# Patient Record
Sex: Female | Born: 1975 | Race: Black or African American | Hispanic: No | Marital: Single | Smoking: Never smoker
Health system: Southern US, Community
[De-identification: ages and names within clinical notes are randomized; demographics above are authoritative.]

## PROBLEM LIST (undated history)

## (undated) DIAGNOSIS — K219 Gastro-esophageal reflux disease without esophagitis: Secondary | ICD-10-CM

## (undated) DIAGNOSIS — F329 Major depressive disorder, single episode, unspecified: Secondary | ICD-10-CM

## (undated) DIAGNOSIS — F32A Depression, unspecified: Secondary | ICD-10-CM

## (undated) DIAGNOSIS — M1990 Unspecified osteoarthritis, unspecified site: Secondary | ICD-10-CM

## (undated) HISTORY — DX: Major depressive disorder, single episode, unspecified: F32.9

## (undated) HISTORY — DX: Unspecified osteoarthritis, unspecified site: M19.90

## (undated) HISTORY — PX: OTHER SURGICAL HISTORY: SHX169

## (undated) HISTORY — DX: Depression, unspecified: F32.A

## (undated) HISTORY — DX: Gastro-esophageal reflux disease without esophagitis: K21.9

---

## 2005-01-03 ENCOUNTER — Emergency Department (HOSPITAL_COMMUNITY): Admission: EM | Admit: 2005-01-03 | Discharge: 2005-01-03 | Payer: Self-pay | Admitting: Emergency Medicine

## 2010-12-26 ENCOUNTER — Ambulatory Visit: Payer: Self-pay | Admitting: Urology

## 2012-11-17 ENCOUNTER — Ambulatory Visit: Payer: Self-pay | Admitting: Internal Medicine

## 2014-06-29 ENCOUNTER — Ambulatory Visit: Payer: Self-pay | Admitting: Nurse Practitioner

## 2014-10-16 ENCOUNTER — Telehealth: Payer: Self-pay | Admitting: Internal Medicine

## 2014-10-16 ENCOUNTER — Ambulatory Visit (INDEPENDENT_AMBULATORY_CARE_PROVIDER_SITE_OTHER): Payer: 59 | Admitting: Gastroenterology

## 2014-10-16 ENCOUNTER — Encounter: Payer: Self-pay | Admitting: Gastroenterology

## 2014-10-16 VITALS — BP 118/73 | HR 76 | Temp 97.2°F | Resp 18 | Ht 64.0 in | Wt 236.0 lb

## 2014-10-16 DIAGNOSIS — K219 Gastro-esophageal reflux disease without esophagitis: Secondary | ICD-10-CM | POA: Insufficient documentation

## 2014-10-16 DIAGNOSIS — K5900 Constipation, unspecified: Secondary | ICD-10-CM

## 2014-10-16 MED ORDER — OMEPRAZOLE 20 MG PO CPDR: 20.0000 mg | DELAYED_RELEASE_CAPSULE | Freq: Every day | ORAL | Status: DC

## 2014-10-16 NOTE — Telephone Encounter (Signed)
PATIENT STATED THAT SHE WOULD CALL us ABOUT A FOLLOW UP APPOINTMENT

## 2014-10-16 NOTE — Assessment & Plan Note (Signed)
Chronic, without any concerning features. No improvement with Miralax or Linzess. Start Amitiza 8 mcg BID. Samples provided. If this works well, will call in prescription. Return in 6-8 weeks. High fiber diet.

## 2014-10-16 NOTE — Patient Instructions (Signed)
Start taking Prilosec once each morning, 30 minutes prior to breakfast.   For constipation: start taking Amitiza 1 gelcap WITH FOOD twice a day.   Please review the high fiber and reflux diet.   Food Choices for Gastroesophageal Reflux Disease When you have gastroesophageal reflux disease (GERD), the foods you eat and your eating habits are very important. Choosing the right foods can help ease the discomfort of GERD. WHAT GENERAL GUIDELINES DO I NEED TO FOLLOW?  Choose fruits, vegetables, whole grains, low-fat dairy products, and low-fat meat, fish, and poultry.  Limit fats such as oils, salad dressings, butter, nuts, and avocado.  Keep a food diary to identify foods that cause symptoms.  Avoid foods that cause reflux. These may be different for different people.  Eat frequent small meals instead of three large meals each day.  Eat your meals slowly, in a relaxed setting.  Limit fried foods.  Cook foods using methods other than frying.  Avoid drinking alcohol.  Avoid drinking large amounts of liquids with your meals.  Avoid bending over or lying down until 2-3 hours after eating. WHAT FOODS ARE NOT RECOMMENDED? The following are some foods and drinks that may worsen your symptoms: Vegetables Tomatoes. Tomato juice. Tomato and spaghetti sauce. Chili peppers. Onion and garlic. Horseradish. Fruits Oranges, grapefruit, and lemon (fruit and juice). Meats High-fat meats, fish, and poultry. This includes hot dogs, ribs, ham, sausage, salami, and bacon. Dairy Whole milk and chocolate milk. Sour cream. Cream. Butter. Ice cream. Cream cheese.  Beverages Coffee and tea, with or without caffeine. Carbonated beverages or energy drinks. Condiments Hot sauce. Barbecue sauce.  Sweets/Desserts Chocolate and cocoa. Donuts. Peppermint and spearmint. Fats and Oils High-fat foods, including [place] fries and potato chips. Other Vinegar. Strong spices, such as black pepper, white pepper,  red pepper, cayenne, curry powder, cloves, ginger, and chili powder. The items listed above may not be a complete list of foods and beverages to avoid. Contact your dietitian for more information. Document Released: 12/15/2005 Document Revised: 12/20/2013 Document Reviewed: 10/19/2013 [facility] Patient Information 2015 [place], [place]. This information is not intended to replace advice given to you by your health care provider. Make sure you discuss any questions you have with your health care provider. High-Fiber Diet Fiber is found in fruits, vegetables, and grains. A high-fiber diet encourages the addition of more whole grains, legumes, fruits, and vegetables in your diet. The recommended amount of fiber for adult males is 38 g per day. For adult females, it is 25 g per day. Pregnant and lactating women should get 28 g of fiber per day. If you have a digestive or bowel problem, ask your caregiver for advice before adding high-fiber foods to your diet. Eat a variety of high-fiber foods instead of only a select few type of foods.  PURPOSE  To increase stool bulk.  To make bowel movements more regular to prevent constipation.  To lower cholesterol.  To prevent overeating. WHEN IS THIS DIET USED?  It may be used if you have constipation and hemorrhoids.  It may be used if you have uncomplicated diverticulosis (intestine condition) and irritable bowel syndrome.  It may be used if you need help with weight management.  It may be used if you want to add it to your diet as a protective measure against atherosclerosis, diabetes, and cancer. SOURCES OF FIBER  Whole-grain breads and cereals.  Fruits, such as apples, oranges, bananas, berries, prunes, and pears.  Vegetables, such as green peas, carrots, sweet  potatoes, beets, broccoli, cabbage, spinach, and artichokes.  Legumes, such split peas, soy, lentils.  Almonds. FIBER CONTENT IN FOODS Starches and Grains / Dietary Fiber  (g)  Cheerios, 1 cup / 3 g  Corn Flakes cereal, 1 cup / 0.7 g  Rice crispy treat cereal, 1 cup / 0.3 g  Instant oatmeal (cooked),  cup / 2 g  Frosted wheat cereal, 1 cup / 5.1 g  Brown, long-grain rice (cooked), 1 cup / 3.5 g  White, long-grain rice (cooked), 1 cup / 0.6 g  Enriched macaroni (cooked), 1 cup / 2.5 g Legumes / Dietary Fiber (g)  Baked beans (canned, plain, or vegetarian),  cup / 5.2 g  Kidney beans (canned),  cup / 6.8 g  Pinto beans (cooked),  cup / 5.5 g Breads and Crackers / Dietary Fiber (g)  Plain or honey graham crackers, 2 squares / 0.7 g  Saltine crackers, 3 squares / 0.3 g  Plain, salted pretzels, 10 pieces / 1.8 g  Whole-wheat bread, 1 slice / 1.9 g  White bread, 1 slice / 0.7 g  Raisin bread, 1 slice / 1.2 g  Plain bagel, 3 oz / 2 g  Flour tortilla, 1 oz / 0.9 g  Corn tortilla, 1 small / 1.5 g  Hamburger or hotdog bun, 1 small / 0.9 g Fruits / Dietary Fiber (g)  Apple with skin, 1 medium / 4.4 g  Sweetened applesauce,  cup / 1.5 g  Banana,  medium / 1.5 g  Grapes, 10 grapes / 0.4 g  Orange, 1 small / 2.3 g  Raisin, 1.5 oz / 1.6 g  Melon, 1 cup / 1.4 g Vegetables / Dietary Fiber (g)  Green beans (canned),  cup / 1.3 g  Carrots (cooked),  cup / 2.3 g  Broccoli (cooked),  cup / 2.8 g  Peas (cooked),  cup / 4.4 g  Mashed potatoes,  cup / 1.6 g  Lettuce, 1 cup / 0.5 g  Corn (canned),  cup / 1.6 g  Tomato,  cup / 1.1 g Document Released: 12/15/2005 Document Revised: 06/15/2012 Document Reviewed: 03/18/2012 [employer] Patient Information 2015 [place], [place]. This information is not intended to replace advice given to you by your health care provider. Make sure you discuss any questions you have with your health care provider.   Food Choices for Gastroesophageal Reflux Disease When you have gastroesophageal reflux disease (GERD), the foods you eat and your eating habits are very important. Choosing the  right foods can help ease the discomfort of GERD. WHAT GENERAL GUIDELINES DO I NEED TO FOLLOW?  Choose fruits, vegetables, whole grains, low-fat dairy products, and low-fat meat, fish, and poultry.  Limit fats such as oils, salad dressings, butter, nuts, and avocado.  Keep a food diary to identify foods that cause symptoms.  Avoid foods that cause reflux. These may be different for different people.  Eat frequent small meals instead of three large meals each day.  Eat your meals slowly, in a relaxed setting.  Limit fried foods.  Cook foods using methods other than frying.  Avoid drinking alcohol.  Avoid drinking large amounts of liquids with your meals.  Avoid bending over or lying down until 2-3 hours after eating. WHAT FOODS ARE NOT RECOMMENDED? The following are some foods and drinks that may worsen your symptoms: Vegetables Tomatoes. Tomato juice. Tomato and spaghetti sauce. Chili peppers. Onion and garlic. Horseradish. Fruits Oranges, grapefruit, and lemon (fruit and juice). Meats High-fat meats, fish, and poultry. This  includes hot dogs, ribs, ham, sausage, salami, and bacon. Dairy Whole milk and chocolate milk. Sour cream. Cream. Butter. Ice cream. Cream cheese.  Beverages Coffee and tea, with or without caffeine. Carbonated beverages or energy drinks. Condiments Hot sauce. Barbecue sauce.  Sweets/Desserts Chocolate and cocoa. Donuts. Peppermint and spearmint. Fats and Oils High-fat foods, including [place] fries and potato chips. Other Vinegar. Strong spices, such as black pepper, white pepper, red pepper, cayenne, curry powder, cloves, ginger, and chili powder. The items listed above may not be a complete list of foods and beverages to avoid. Contact your dietitian for more information. Document Released: 12/15/2005 Document Revised: 12/20/2013 Document Reviewed: 10/19/2013 [facility] Patient Information 2015 [place], [place]. This information is not intended to  replace advice given to you by your health care provider. Make sure you discuss any questions you have with your health care provider.

## 2014-10-16 NOTE — Assessment & Plan Note (Signed)
Without any concerning features. Start omeprazole once daily, 30 minutes before breakfast. GERD diet provided. Return in 6-8 weeks.

## 2014-10-16 NOTE — Progress Notes (Signed)
      Primary Care Physician:  Dr. Humphrey Rolls Primary Gastroenterologist:  Dr. Gala Romney   Chief Complaint  Patient presents with  . Constipation    HPI:   Kaitlyn Massey presents today as a self-referral secondary to constipation. Reports abdominal pain, nausea, constipation. Nausea intermittent. Milk bothers her. Miralax for constipation. Not much improvement with Linzess. BM every few days, hard balls. No straining. No bright red blood per rectum. Mid-abdominal discomfort, wraps around back. Level of umbilicus, wrapping around. Intermittent. Usually related to certain types of food such as corn. Nausea with eating occasionally. Symptoms improved after defecation. Mild reflux, some nocturnal symptoms. No dysphagia. No NSAIDs routinely. Occasionally with cramps. Over the counter Vit E, C. Chronic constipation.   Past Medical History  Diagnosis Date  . Depression     Past Surgical History  Procedure Laterality Date  . None      Current Outpatient Prescriptions  Medication Sig Dispense Refill  . Probiotic Product (PROBIOTIC DAILY) CAPS Take by mouth once.      . venlafaxine (EFFEXOR) 75 MG tablet Take 75 mg by mouth once.       No current facility-administered medications for this visit.    Allergies as of 10/16/2014  . (Not on File)    Family History  Problem Relation Age of Onset  . Colon cancer Neg Hx     History   Social History  . Marital Status: Single    Spouse Name: N/A    Number of Children: N/A  . Years of Education: N/A   Occupational History  . Not on file.   Social History Main Topics  . Smoking status: Never Smoker   . Smokeless tobacco: Not on file  . Alcohol Use: No  . Drug Use: No  . Sexual Activity: Not on file   Other Topics Concern  . Not on file   Social History Narrative  . No narrative on file    Review of Systems: Gen: Denies any fever, chills, fatigue, weight loss, lack of appetite.  CV: Denies chest pain, heart palpitations,  peripheral edema, syncope.  Resp: Denies shortness of breath at rest or with exertion. Denies wheezing or cough.  GI: see HPI GU : Denies urinary burning, urinary frequency, urinary hesitancy MS: knee pain, arthritis Derm: Denies rash, itching, dry skin Psych: Denies significant depression, anxiety, memory loss, and confusion Heme: Denies bruising, bleeding, and enlarged lymph nodes.  Physical Exam: BP 118/73  Pulse 76  Temp(Src) 97.2 F (36.2 C) (Oral)  Resp 18  Ht 5' 4"  (1.626 m)  Wt 236 lb (107.049 kg)  BMI 40.49 kg/m2 General:   Alert and oriented. Pleasant and cooperative. Well-nourished and well-developed.  Head:  Normocephalic and atraumatic. Eyes:  Without icterus, sclera clear and conjunctiva pink.  Ears:  Normal auditory acuity. Nose:  No deformity, discharge,  or lesions. Mouth:  No deformity or lesions, oral mucosa pink.  Lungs:  Clear to auscultation bilaterally. No wheezes, rales, or rhonchi. No distress.  Heart:  S1, S2 present without murmurs appreciated.  Abdomen:  +BS, soft, non-tender and non-distended. No HSM noted. No guarding or rebound. No masses appreciated.  Rectal:  Deferred  Msk:  Symmetrical without gross deformities. Normal posture. Extremities:  Without clubbing or edema. Neurologic:  Alert and  oriented x4;  grossly normal neurologically. Skin:  Intact without significant lesions or rashes. Psych:  Alert and cooperative. Normal mood and affect.

## 2014-10-25 NOTE — Progress Notes (Signed)
No pcp on file

## 2015-05-25 ENCOUNTER — Other Ambulatory Visit: Payer: Self-pay | Admitting: Nurse Practitioner

## 2015-05-25 DIAGNOSIS — Z1231 Encounter for screening mammogram for malignant neoplasm of breast: Secondary | ICD-10-CM

## 2015-07-06 ENCOUNTER — Ambulatory Visit
Admission: RE | Admit: 2015-07-06 | Discharge: 2015-07-06 | Disposition: A | Discharge status: Home / Self Care | Payer: 59 | Source: Ambulatory Visit | Attending: Nurse Practitioner | Admitting: Nurse Practitioner

## 2015-07-06 DIAGNOSIS — Z1231 Encounter for screening mammogram for malignant neoplasm of breast: Secondary | ICD-10-CM | POA: Insufficient documentation

## 2016-04-08 ENCOUNTER — Other Ambulatory Visit: Payer: Self-pay | Admitting: Nurse Practitioner

## 2016-04-08 DIAGNOSIS — Z1231 Encounter for screening mammogram for malignant neoplasm of breast: Secondary | ICD-10-CM

## 2016-07-11 ENCOUNTER — Other Ambulatory Visit: Payer: Self-pay | Admitting: Nurse Practitioner

## 2016-07-11 ENCOUNTER — Ambulatory Visit
Admission: RE | Admit: 2016-07-11 | Discharge: 2016-07-11 | Disposition: A | Discharge status: Home / Self Care | Payer: 59 | Source: Ambulatory Visit | Attending: Nurse Practitioner | Admitting: Nurse Practitioner

## 2016-07-11 DIAGNOSIS — Z1231 Encounter for screening mammogram for malignant neoplasm of breast: Secondary | ICD-10-CM

## 2016-08-27 ENCOUNTER — Encounter: Payer: Self-pay | Admitting: Urology

## 2016-08-27 ENCOUNTER — Ambulatory Visit (INDEPENDENT_AMBULATORY_CARE_PROVIDER_SITE_OTHER): Payer: 59 | Admitting: Urology

## 2016-08-27 VITALS — BP 113/72 | HR 81 | Ht 64.0 in | Wt 259.5 lb

## 2016-08-27 DIAGNOSIS — M545 Low back pain, unspecified: Secondary | ICD-10-CM

## 2016-08-27 DIAGNOSIS — R3129 Other microscopic hematuria: Secondary | ICD-10-CM | POA: Diagnosis not present

## 2016-08-27 DIAGNOSIS — N200 Calculus of kidney: Secondary | ICD-10-CM | POA: Diagnosis not present

## 2016-08-27 LAB — URINALYSIS, COMPLETE
Bilirubin, UA: NEGATIVE
GLUCOSE, UA: NEGATIVE
Ketones, UA: NEGATIVE
Leukocytes, UA: NEGATIVE
Nitrite, UA: NEGATIVE
PROTEIN UA: NEGATIVE
Specific Gravity, UA: 1.01 (ref 1.005–1.030)
Urobilinogen, Ur: 0.2 mg/dL (ref 0.2–1.0)
pH, UA: 6.5 (ref 5.0–7.5)

## 2016-08-27 LAB — MICROSCOPIC EXAMINATION: Bacteria, UA: NONE SEEN

## 2016-08-27 NOTE — Progress Notes (Signed)
H&P  Chief Complaint:  Microscopic hematuria, low back pain  History of Present Illness:   The patient was seen by Dr. Chancy Milroy earlier this month when she complained of low back pain. By report her urinalysis showed hematuria. Her UA today reveals 3-10 red blood cells per high-powered field but is otherwise clear. There is a report of a renal ultrasound done at their office which shows a 12.6 mm "nonobstructing stone" in the left kidney.   Her pain is in the right lower back and it hurts worse when she stands and bends. She has no history of kidney stones. She's had no gross hematuria. She has some occasional nocturia and frequency. She voids with a good stream and without dysuria or pain. She has no exposure risks.      Past Medical History:  Diagnosis Date  . Arthritis   . Depression   . GERD (gastroesophageal reflux disease)    Past Surgical History:  Procedure Laterality Date  . None      Home Medications:   (Not in a hospital admission) Allergies: No Known Allergies  Family History  Problem Relation Age of Onset  . Breast cancer Maternal Aunt   . Colon cancer Neg Hx   . Bladder Cancer Neg Hx   . Kidney cancer Neg Hx    Social History:  reports that she has never smoked. She has never used smokeless tobacco. She reports that she does not drink alcohol or use drugs.  ROS: A complete review of systems was performed.  All systems are negative except for pertinent findings as noted. ROS   Physical Exam:  Vital signs in last 24 hours: @VSRANGES @ General:  Alert and oriented, No acute distress HEENT: Normocephalic, atraumatic Cardiovascular: Regular rate and rhythm Lungs: Regular rate and effort Abdomen: Soft, nontender, nondistended, no abdominal masses Extremities: No edema Neurologic: Grossly intact  Laboratory Data:  No results found for this or any previous visit (from the past 24 hour(s)). No results found for this or any previous visit (from the past 240  hour(s)). Creatinine: No results for input(s): CREATININE in the last 168 hours.  Impression/Assessment/plan:  1) microhematuria - will eval with non-con CT and cystoscopy - she elects to proceed. Given possible kidney stone on U/S, CT will also eval for presence of stone, size, location, density, scout visibility, etc.   2) right low back pain - discussed likely musculoskeletal. Doubt related to stone.      [place] 08/27/2016, 11:58 AM

## 2016-09-05 ENCOUNTER — Ambulatory Visit: Payer: 59

## 2016-09-08 ENCOUNTER — Ambulatory Visit
Admission: RE | Admit: 2016-09-08 | Discharge: 2016-09-08 | Disposition: A | Discharge status: Home / Self Care | Payer: 59 | Source: Ambulatory Visit | Attending: Urology | Admitting: Urology

## 2016-09-08 DIAGNOSIS — M545 Low back pain, unspecified: Secondary | ICD-10-CM

## 2016-09-08 DIAGNOSIS — N281 Cyst of kidney, acquired: Secondary | ICD-10-CM | POA: Diagnosis not present

## 2016-09-08 DIAGNOSIS — R3129 Other microscopic hematuria: Secondary | ICD-10-CM | POA: Diagnosis not present

## 2016-09-09 ENCOUNTER — Telehealth: Payer: Self-pay

## 2016-09-09 NOTE — Telephone Encounter (Signed)
-----   Message from Festus Aloe, MD sent at 09/09/2016  9:14 AM EDT ----- Notify patient -- CT was normal. No stones. F/u for cystoscopy as planned.   ----- Message ----- From: Royanne Foots, CMA Sent: 09/09/2016   8:02 AM To: Festus Aloe, MD    ----- Message ----- From: Interface, Rad Results In Sent: 09/08/2016   5:14 PM To: Rowe Robert Clinical

## 2016-09-09 NOTE — Telephone Encounter (Signed)
Left pt mess to call/SW

## 2016-09-15 ENCOUNTER — Ambulatory Visit (INDEPENDENT_AMBULATORY_CARE_PROVIDER_SITE_OTHER): Payer: 59 | Admitting: Urology

## 2016-09-15 DIAGNOSIS — R3129 Other microscopic hematuria: Secondary | ICD-10-CM | POA: Diagnosis not present

## 2016-09-15 DIAGNOSIS — M545 Low back pain, unspecified: Secondary | ICD-10-CM

## 2016-09-15 LAB — URINALYSIS, COMPLETE
Bilirubin, UA: NEGATIVE
GLUCOSE, UA: NEGATIVE
Ketones, UA: NEGATIVE
LEUKOCYTES UA: NEGATIVE
Nitrite, UA: NEGATIVE
Specific Gravity, UA: >1.03 — ABNORMAL HIGH (ref 1.005–1.030)
UUROB: 0.2 mg/dL (ref 0.2–1.0)
pH, UA: 5.5 (ref 5.0–7.5)

## 2016-09-15 LAB — MICROSCOPIC EXAMINATION: Epithelial Cells (non renal): >10 /hpf — AB (ref 0–10)

## 2016-09-15 MED ORDER — CIPROFLOXACIN HCL 500 MG PO TABS
500.0000 mg | ORAL_TABLET | Freq: Once | ORAL | Status: AC
  Administered 2016-09-15: 500 mg via ORAL

## 2016-09-15 MED ORDER — LIDOCAINE HCL 2 % EX GEL
1.0000 "application " | Freq: Once | CUTANEOUS | Status: AC
  Administered 2016-09-15: 1 via URETHRAL

## 2016-09-15 NOTE — Progress Notes (Signed)
09/15/2016 12:29 PM   Kaitlyn Massey 07-22-1976 [id number]  Referring provider: No referring provider defined for this encounter.  No chief complaint on file.   HPI:  1 - Microscopic Hematuria - RBC's noted on UA x several. Non-smoker. No chronic solvent exposure. Cysto 08/2016 unremarkable.   2 - Back Pain - positional lumbago x years. She is morbidly obese. CT 2017 w/o stones or hydro whatsoever.  3 - Non-Complex Rt Renal Cyst - Rt 38m mid fluid-density cyst by stone CT 2017. No mass effect / coarse calcificaitons, or nodules.   Today "Kaitlyn Massey" is seen for cysto and f/u above. NO interval gross hematuria.    PMH: Past Medical History:  Diagnosis Date  . Arthritis   . Depression   . GERD (gastroesophageal reflux disease)     Surgical History: Past Surgical History:  Procedure Laterality Date  . None      Home Medications:    Medication List       Accurate as of 09/15/16 12:29 PM. Always use your most recent med list.          Chromium 200 MCG Caps Take by mouth.   EUFLEXXA 20 MG/2ML Sosy Generic drug:  Sodium Hyaluronate   Omega-3 350 MG Cpdr Take by mouth.   omeprazole 20 MG capsule Commonly known as:  PRILOSEC Take 1 capsule (20 mg total) by mouth daily. Take 30 minutes prior to breakfast   phenazopyridine 95 MG tablet Commonly known as:  PYRIDIUM Take 95 mg by mouth 3 (three) times daily as needed for pain.   PROBIOTIC DAILY Caps Take by mouth once.   venlafaxine XR 75 MG 24 hr capsule Commonly known as:  EFFEXOR-XR   vitamin C 500 MG tablet Commonly known as:  ASCORBIC ACID Take 500 mg by mouth daily.   XULANE 150-35 MCG/24HR transdermal patch Generic drug:  norelgestromin-ethinyl estradiol       Allergies: No Known Allergies  Family History: Family History  Problem Relation Age of Onset  . Breast cancer Maternal Aunt   . Colon cancer Neg Hx   . Bladder Cancer Neg Hx   . Kidney cancer Neg Hx     Social History:   reports that she has never smoked. She has never used smokeless tobacco. She reports that she does not drink alcohol or use drugs.   Review of Systems  Gastrointestinal (upper)  : Negative for upper GI symptoms  Gastrointestinal (lower) : Negative for lower GI symptoms  Constitutional : Negative for symptoms  Skin: Negative for skin symptoms  Eyes: Negative for eye symptoms  Ear/Nose/Throat : Negative for Ear/Nose/Throat symptoms  Hematologic/Lymphatic: Negative for Hematologic/Lymphatic symptoms  Cardiovascular : Negative for cardiovascular symptoms  Respiratory : Negative for respiratory symptoms  Endocrine: Negative for endocrine symptoms  Musculoskeletal: Negative for musculoskeletal symptoms  Neurological: Negative for neurological symptoms  Psychologic: Negative for psychiatric symptoms    Physical Exam: LMP 08/20/2016 (Exact Date) Comment: Pt is not sexually active.   Constitutional:  Alert and oriented, No acute distress. HEENT: [place] AT, moist mucus membranes.  Trachea midline, no masses. Cardiovascular: No clubbing, cyanosis, or edema. Respiratory: Normal respiratory effort, no increased work of breathing. GI: Abdomen is soft, nontender, nondistended, no abdominal masses GU: No CVA tenderness.  Skin: No rashes, bruises or suspicious lesions. Lymph: No cervical or inguinal adenopathy. Neurologic: Grossly intact, no focal deficits, moving all 4 extremities. Psychiatric: Normal mood and affect.  Laboratory Data: No results found for: WBC, HGB, HCT, MCV, PLT  No results found for: CREATININE  No results found for: PSA  No results found for: TESTOSTERONE  No results found for: HGBA1C  Urinalysis    Component Value Date/Time   APPEARANCEUR Clear 08/27/2016 1152   GLUCOSEU Negative 08/27/2016 1152   BILIRUBINUR Negative 08/27/2016 1152   PROTEINUR Negative 08/27/2016 1152   NITRITE Negative 08/27/2016 1152   LEUKOCYTESUR Negative  08/27/2016 1152    Pertinent Imaging: As per HPI     Cystoscopy Procedure Note  Patient identification was confirmed, informed consent was obtained, and patient was prepped using Betadine solution.  Lidocaine jelly was administered per urethral meatus.    Preoperative abx where received prior to procedure.    Procedure: - Good introital estrogenation.  - Flexible cystoscope introduced, without any difficulty.   - Thorough search of the bladder revealed:    normal urethral meatus    normal urothelium    no stones    no ulcers     no tumors    no urethral polyps    no trabeculation  - Ureteral orifices were normal in position and appearance.  Post-Procedure: - Patient tolerated the procedure well  Assessment & Plan:    1 - Microscopic Hematuria - low risk. No stones, masses on CT or cysto. Strongly rec repeat eval for future gross / visible episodes especially if recurrent.    2 - Back Pain - No hydro / urolithiasis. Suspect MSK back pain. Reinforced weight loss single highest yield modifiable factor that will likely help.   3 - Non-Complex Rt Renal Cyst - no mass effect or complex features. No further eval warranted.  RTC 1 year any provider to verify no interval gross blood, then prn if stable.    Alexis Frock, [facility] [address] [place], [place] [address] [phone]

## 2017-06-01 ENCOUNTER — Other Ambulatory Visit: Payer: Self-pay | Admitting: Nurse Practitioner

## 2017-06-01 DIAGNOSIS — Z1231 Encounter for screening mammogram for malignant neoplasm of breast: Secondary | ICD-10-CM

## 2017-07-14 ENCOUNTER — Ambulatory Visit
Admission: RE | Admit: 2017-07-14 | Discharge: 2017-07-14 | Disposition: A | Discharge status: Home / Self Care | Payer: 59 | Source: Ambulatory Visit | Attending: Nurse Practitioner | Admitting: Nurse Practitioner

## 2017-07-14 DIAGNOSIS — Z1231 Encounter for screening mammogram for malignant neoplasm of breast: Secondary | ICD-10-CM | POA: Diagnosis not present

## 2017-07-17 ENCOUNTER — Ambulatory Visit: Payer: 59

## 2017-09-18 ENCOUNTER — Ambulatory Visit: Payer: 59

## 2017-10-09 ENCOUNTER — Ambulatory Visit (INDEPENDENT_AMBULATORY_CARE_PROVIDER_SITE_OTHER): Payer: 59 | Admitting: Urology

## 2017-10-09 VITALS — BP 113/75 | HR 98 | Ht 64.0 in | Wt 262.5 lb

## 2017-10-09 DIAGNOSIS — R3129 Other microscopic hematuria: Secondary | ICD-10-CM

## 2017-10-09 LAB — URINALYSIS, COMPLETE
Bilirubin, UA: NEGATIVE
Glucose, UA: NEGATIVE
Nitrite, UA: NEGATIVE
PH UA: 5.5 (ref 5.0–7.5)
SPEC GRAV UA: 1.025 (ref 1.005–1.030)
Urobilinogen, Ur: 0.2 mg/dL (ref 0.2–1.0)

## 2017-10-09 LAB — MICROSCOPIC EXAMINATION: WBC UA: NONE SEEN /HPF (ref 0–?)

## 2017-10-09 NOTE — Progress Notes (Signed)
10/09/2017 2:27 PM   Kaitlyn Massey 1976-05-21 [id number]  Referring provider: No referring provider defined for this encounter.  Chief Complaint  Patient presents with  . Nephrolithiasis    HPI:  1 - Microscopic Hematuria - RBC's noted on UA x several. Non-smoker. No chronic solvent exposure. Cysto 08/2016 unremarkable.   2 - Back Pain - positional lumbago x years. She is morbidly obese. CT 2017 w/o stones or hydro whatsoever.  3 - Non-Complex Rt Renal Cyst - Rt 74m mid fluid-density cyst by stone CT 2017. No mass effect / coarse calcificaitons, or nodules.   Today "Kaitlyn Massey" is seen for cysto and f/u above. NO interval gross hematuria.      PMH: Past Medical History:  Diagnosis Date  . Arthritis   . Depression   . GERD (gastroesophageal reflux disease)     Surgical History: Past Surgical History:  Procedure Laterality Date  . None      Home Medications:  Allergies as of 10/09/2017      Reactions   No Known Allergies       Medication List       Accurate as of 10/09/17  2:27 PM. Always use your most recent med list.          Chromium 200 MCG Caps Take by mouth.   EUFLEXXA 20 MG/2ML Sosy Generic drug:  Sodium Hyaluronate   methimazole 5 MG tablet Commonly known as:  TAPAZOLE Take 1 tablet by mouth daily.   montelukast 10 MG tablet Commonly known as:  SINGULAIR Take 10 mg by mouth at bedtime.   Omega-3 350 MG Cpdr Take by mouth.   omeprazole 20 MG capsule Commonly known as:  PRILOSEC Take 1 capsule (20 mg total) by mouth daily. Take 30 minutes prior to breakfast   venlafaxine XR 75 MG 24 hr capsule Commonly known as:  EFFEXOR-XR   vitamin C 500 MG tablet Commonly known as:  ASCORBIC ACID Take 500 mg by mouth daily.   XULANE 150-35 MCG/24HR transdermal patch Generic drug:  norelgestromin-ethinyl estradiol       Allergies:  Allergies  Allergen Reactions  . No Known Allergies     Family History: Family History  Problem  Relation Age of Onset  . Breast cancer Maternal Aunt   . Colon cancer Neg Hx   . Bladder Cancer Neg Hx   . Kidney cancer Neg Hx     Social History:  reports that she has never smoked. She has never used smokeless tobacco. She reports that she does not drink alcohol or use drugs.  ROS: UROLOGY Frequent Urination?: No Hard to postpone urination?: No Burning/pain with urination?: No Get up at night to urinate?: No Leakage of urine?: No Urine stream starts and stops?: No Trouble starting stream?: No Do you have to strain to urinate?: No Blood in urine?: No Urinary tract infection?: No Sexually transmitted disease?: No Injury to kidneys or bladder?: No Painful intercourse?: No Weak stream?: No Currently pregnant?: No Vaginal bleeding?: No Last menstrual period?: nn  Gastrointestinal Nausea?: No Vomiting?: No Indigestion/heartburn?: Yes Diarrhea?: No Constipation?: No  Constitutional Fever: No Night sweats?: Yes Weight loss?: No Fatigue?: Yes  Skin Skin rash/lesions?: No Itching?: Yes  Eyes Blurred vision?: Yes Double vision?: No  Ears/Nose/Throat Sore throat?: No Sinus problems?: Yes  Hematologic/Lymphatic Swollen glands?: No Easy bruising?: No  Cardiovascular Leg swelling?: No Chest pain?: Yes  Respiratory Cough?: No Shortness of breath?: Yes  Endocrine Excessive thirst?: No  Musculoskeletal Back pain?: No  Joint pain?: Yes  Neurological Headaches?: No Dizziness?: No  Psychologic Depression?: No Anxiety?: No  Physical Exam: BP 113/75 (BP Location: Right Arm, Patient Position: Sitting, Cuff Size: Large)   Pulse 98   Ht 5' 4"  (1.626 m)   Wt 262 lb 8 oz (119.1 kg)   BMI 45.06 kg/m   Constitutional:  Alert and oriented, No acute distress. HEENT: [place] AT, moist mucus membranes.  Trachea midline, no masses. Cardiovascular: No clubbing, cyanosis, or edema. Respiratory: Normal respiratory effort, no increased work of breathing. GI: Abdomen  is soft, nontender, nondistended, no abdominal masses GU: No CVA tenderness.  Skin: No rashes, bruises or suspicious lesions. Lymph: No cervical or inguinal adenopathy. Neurologic: Grossly intact, no focal deficits, moving all 4 extremities. Psychiatric: Normal mood and affect.  Laboratory Data: No results found for: WBC, HGB, HCT, MCV, PLT  No results found for: CREATININE  No results found for: PSA  No results found for: TESTOSTERONE  No results found for: HGBA1C  Urinalysis    Component Value Date/Time   APPEARANCEUR Cloudy (A) 09/15/2016 1539   GLUCOSEU Negative 09/15/2016 1539   BILIRUBINUR Negative 09/15/2016 1539   PROTEINUR 1+ (A) 09/15/2016 1539   NITRITE Negative 09/15/2016 1539   LEUKOCYTESUR Negative 09/15/2016 1539    Assessment & Plan:    1 - Microscopic Hematuria - low risk. No stones, masses on CT or cysto. Strongly rec repeat eval for future gross / visible episodes especially if recurrent.  Repeat U/A negative. Will f/u prn return of hematuria at this point  2 - Non-Complex Rt Renal Cyst - no mass effect or complex features. No further eval warranted.   Return if symptoms worsen or fail to improve.  Kaitlyn Retort, MD  [facility] [address] [place], [place] [address] [phone]

## 2018-02-04 DIAGNOSIS — R10816 Epigastric abdominal tenderness: Secondary | ICD-10-CM | POA: Insufficient documentation

## 2018-02-04 DIAGNOSIS — E66813 Obesity, class 3: Secondary | ICD-10-CM | POA: Insufficient documentation

## 2018-02-05 ENCOUNTER — Other Ambulatory Visit: Payer: Self-pay | Admitting: Nurse Practitioner

## 2018-02-05 DIAGNOSIS — K219 Gastro-esophageal reflux disease without esophagitis: Secondary | ICD-10-CM

## 2018-02-24 ENCOUNTER — Ambulatory Visit
Admission: RE | Admit: 2018-02-24 | Discharge: 2018-02-24 | Disposition: A | Discharge status: Home / Self Care | Payer: BLUE CROSS/BLUE SHIELD | Source: Ambulatory Visit | Attending: Nurse Practitioner | Admitting: Nurse Practitioner

## 2018-02-24 DIAGNOSIS — R93422 Abnormal radiologic findings on diagnostic imaging of left kidney: Secondary | ICD-10-CM | POA: Insufficient documentation

## 2018-02-24 DIAGNOSIS — R93421 Abnormal radiologic findings on diagnostic imaging of right kidney: Secondary | ICD-10-CM | POA: Insufficient documentation

## 2018-02-24 DIAGNOSIS — K219 Gastro-esophageal reflux disease without esophagitis: Secondary | ICD-10-CM | POA: Diagnosis not present

## 2018-02-24 DIAGNOSIS — N281 Cyst of kidney, acquired: Secondary | ICD-10-CM | POA: Insufficient documentation

## 2018-06-04 ENCOUNTER — Other Ambulatory Visit: Payer: Self-pay | Admitting: Nurse Practitioner

## 2018-06-04 ENCOUNTER — Ambulatory Visit: Payer: 59

## 2018-06-04 DIAGNOSIS — Z1231 Encounter for screening mammogram for malignant neoplasm of breast: Secondary | ICD-10-CM

## 2018-06-25 ENCOUNTER — Ambulatory Visit: Payer: 59

## 2018-07-16 ENCOUNTER — Encounter: Payer: Self-pay | Admitting: Urology

## 2018-07-16 ENCOUNTER — Ambulatory Visit: Payer: BLUE CROSS/BLUE SHIELD | Admitting: Urology

## 2018-07-16 ENCOUNTER — Ambulatory Visit
Admission: RE | Admit: 2018-07-16 | Discharge: 2018-07-16 | Disposition: A | Discharge status: Home / Self Care | Payer: BLUE CROSS/BLUE SHIELD | Source: Ambulatory Visit | Attending: Nurse Practitioner | Admitting: Nurse Practitioner

## 2018-07-16 ENCOUNTER — Other Ambulatory Visit: Payer: Self-pay

## 2018-07-16 ENCOUNTER — Ambulatory Visit: Payer: 59 | Admitting: Urology

## 2018-07-16 VITALS — BP 136/81 | HR 75 | Ht 64.0 in | Wt 260.0 lb

## 2018-07-16 DIAGNOSIS — Z1231 Encounter for screening mammogram for malignant neoplasm of breast: Secondary | ICD-10-CM | POA: Diagnosis not present

## 2018-07-16 DIAGNOSIS — M2240 Chondromalacia patellae, unspecified knee: Secondary | ICD-10-CM | POA: Insufficient documentation

## 2018-07-16 DIAGNOSIS — R3129 Other microscopic hematuria: Secondary | ICD-10-CM

## 2018-07-16 DIAGNOSIS — N281 Cyst of kidney, acquired: Secondary | ICD-10-CM | POA: Diagnosis not present

## 2018-07-16 LAB — MICROSCOPIC EXAMINATION

## 2018-07-16 LAB — URINALYSIS, COMPLETE
Bilirubin, UA: NEGATIVE
Glucose, UA: NEGATIVE
Ketones, UA: NEGATIVE
Leukocytes, UA: NEGATIVE
NITRITE UA: NEGATIVE
PH UA: 6 (ref 5.0–7.5)
Specific Gravity, UA: >1.03 — ABNORMAL HIGH (ref 1.005–1.030)
UUROB: 0.2 mg/dL (ref 0.2–1.0)

## 2018-07-18 ENCOUNTER — Encounter: Payer: Self-pay | Admitting: Urology

## 2018-07-18 DIAGNOSIS — N281 Cyst of kidney, acquired: Secondary | ICD-10-CM | POA: Insufficient documentation

## 2018-07-18 NOTE — Progress Notes (Signed)
07/16/2018 11:56 AM   Kaitlyn Massey [birth year] [id number]  Referring provider: Danelle Berry, NP [address] [place], [place] [address]  Chief Complaint  Patient presents with  . Renal Cyst    HPI: 42 year old female previously evaluated for microhematuria.  She had a CT scan showing a simple right renal cyst.  Cystoscopy was unremarkable.  She was last seen October 2018 by Dr. Pilar Jarvis with a prn follow-up.  She had an abdominal ultrasound in February 2019 which showed renal cyst and was re-referred by gastroenterology.  She denies flank or abdominal pain.  She denies gross hematuria.  She has no bothersome lower urinary tract symptoms.  Her creatinine was normal.   PMH: Past Medical History:  Diagnosis Date  . Arthritis   . Depression   . GERD (gastroesophageal reflux disease)     Surgical History: Past Surgical History:  Procedure Laterality Date  . None      Home Medications:  Allergies as of 07/16/2018      Reactions   No Known Allergies       Medication List        Accurate as of 07/16/18 11:59 PM. Always use your most recent med list.          Chromium 200 MCG Caps Take by mouth.   EUFLEXXA 20 MG/2ML Sosy Generic drug:  Sodium Hyaluronate   methimazole 5 MG tablet Commonly known as:  TAPAZOLE Take 1 tablet by mouth daily.   montelukast 10 MG tablet Commonly known as:  SINGULAIR Take 10 mg by mouth at bedtime.   Omega-3 350 MG Cpdr Take by mouth.   omeprazole 20 MG capsule Commonly known as:  PRILOSEC Take 1 capsule (20 mg total) by mouth daily. Take 30 minutes prior to breakfast   venlafaxine XR 75 MG 24 hr capsule Commonly known as:  EFFEXOR-XR   vitamin C 500 MG tablet Commonly known as:  ASCORBIC ACID Take 500 mg by mouth daily.   XULANE 150-35 MCG/24HR transdermal patch Generic drug:  norelgestromin-ethinyl estradiol       Allergies:  Allergies  Allergen Reactions  . No Known Allergies     Family  History: Family History  Problem Relation Age of Onset  . Breast cancer Maternal Aunt   . Colon cancer Neg Hx   . Bladder Cancer Neg Hx   . Kidney cancer Neg Hx     Social History:  reports that she has never smoked. She has never used smokeless tobacco. She reports that she does not drink alcohol or use drugs.  ROS: UROLOGY Frequent Urination?: No Hard to postpone urination?: No Burning/pain with urination?: No Get up at night to urinate?: Yes Leakage of urine?: No Urine stream starts and stops?: No Trouble starting stream?: No Do you have to strain to urinate?: No Blood in urine?: No Urinary tract infection?: No Sexually transmitted disease?: No Injury to kidneys or bladder?: No Painful intercourse?: No Weak stream?: No Currently pregnant?: No Vaginal bleeding?: No Last menstrual period?: n  Gastrointestinal Nausea?: No Vomiting?: No Indigestion/heartburn?: Yes Diarrhea?: No Constipation?: No  Constitutional Fever: No Night sweats?: No Weight loss?: No Fatigue?: No  Skin Skin rash/lesions?: No Itching?: No  Eyes Blurred vision?: Yes Double vision?: No  Ears/Nose/Throat Sore throat?: No Sinus problems?: Yes  Hematologic/Lymphatic Swollen glands?: No Easy bruising?: No  Cardiovascular Leg swelling?: No Chest pain?: No  Respiratory Cough?: No Shortness of breath?: No  Endocrine Excessive thirst?: No  Musculoskeletal Back pain?: No Joint  pain?: No  Neurological Headaches?: No Dizziness?: No  Psychologic Depression?: No Anxiety?: Yes  Physical Exam: BP 136/81   Pulse 75   Ht 5' 4"  (1.626 m)   Wt 260 lb (117.9 kg)   LMP 06/27/2018   BMI 44.63 kg/m   Constitutional:  Alert and oriented, No acute distress. HEENT: [place] AT, moist mucus membranes.  Trachea midline, no masses. Cardiovascular: No clubbing, cyanosis, or edema. Respiratory: Normal respiratory effort, no increased work of breathing. GI: Abdomen is soft, nontender,  nondistended, no abdominal masses GU: No CVA tenderness Lymph: No cervical or inguinal lymphadenopathy. Skin: No rashes, bruises or suspicious lesions. Neurologic: Grossly intact, no focal deficits, moving all 4 extremities. Psychiatric: Normal mood and affect.  Laboratory Data:  Urinalysis Microscopy 3-10 RBCs  Pertinent Imaging: Abdominal ultrasound shows 2 simple cyst in the right kidney.   Assessment & Plan:   42 year old female with asymptomatic microhematuria and a prior negative evaluation.  Her microhematuria is stable.  Her ultrasound shows simple renal cysts which do not require further evaluation or follow-up.   Return in about 1 year (around 07/17/2019) for Recheck.   Abbie Sons, [place] [address] [place], [place] [address] [phone]

## 2018-07-19 ENCOUNTER — Other Ambulatory Visit: Payer: Self-pay | Admitting: Nurse Practitioner

## 2018-07-19 DIAGNOSIS — R928 Other abnormal and inconclusive findings on diagnostic imaging of breast: Secondary | ICD-10-CM

## 2018-08-04 ENCOUNTER — Ambulatory Visit: Payer: 59 | Admitting: Urology

## 2018-08-09 ENCOUNTER — Ambulatory Visit
Admission: RE | Admit: 2018-08-09 | Discharge: 2018-08-09 | Disposition: A | Discharge status: Home / Self Care | Payer: BLUE CROSS/BLUE SHIELD | Source: Ambulatory Visit | Attending: Nurse Practitioner | Admitting: Nurse Practitioner

## 2018-08-09 DIAGNOSIS — R928 Other abnormal and inconclusive findings on diagnostic imaging of breast: Secondary | ICD-10-CM | POA: Diagnosis present

## 2019-05-27 ENCOUNTER — Telehealth: Payer: Self-pay | Admitting: Obstetrics and Gynecology

## 2019-05-27 NOTE — Telephone Encounter (Signed)
The patient called and stated that she would like a call back/ Pt was referred from another office . Please advise.

## 2019-06-20 ENCOUNTER — Other Ambulatory Visit: Payer: Self-pay | Admitting: Nurse Practitioner

## 2019-06-20 DIAGNOSIS — Z1231 Encounter for screening mammogram for malignant neoplasm of breast: Secondary | ICD-10-CM

## 2019-06-20 NOTE — Telephone Encounter (Signed)
[facility]  To schedule an appointment on 05/25/19. I was out of the office on 5/29 and 6/1. Tried to reach patient again on 05/31/19 @ 11:02, no answer had to leave message. -KEC

## 2019-07-07 ENCOUNTER — Telehealth: Payer: Self-pay

## 2019-07-07 NOTE — Telephone Encounter (Signed)
[facility] for prescreening.

## 2019-07-08 ENCOUNTER — Other Ambulatory Visit: Payer: Self-pay

## 2019-07-08 ENCOUNTER — Ambulatory Visit (INDEPENDENT_AMBULATORY_CARE_PROVIDER_SITE_OTHER): Payer: 59 | Admitting: Certified Nurse Midwife

## 2019-07-08 ENCOUNTER — Encounter: Payer: Self-pay | Admitting: Certified Nurse Midwife

## 2019-07-08 VITALS — BP 126/79 | HR 71 | Ht 64.0 in | Wt 269.2 lb

## 2019-07-08 DIAGNOSIS — R635 Abnormal weight gain: Secondary | ICD-10-CM | POA: Diagnosis not present

## 2019-07-08 DIAGNOSIS — R232 Flushing: Secondary | ICD-10-CM | POA: Diagnosis not present

## 2019-07-08 DIAGNOSIS — N923 Ovulation bleeding: Secondary | ICD-10-CM

## 2019-07-08 NOTE — Patient Instructions (Addendum)
Ethinyl Estradiol; Norelgestromin skin patches What is this medicine? ETHINYL ESTRADIOL;NORELGESTROMIN (ETH in il es tra DYE ole; nor el JES troe min) skin patch is used as a contraceptive (birth control method). This medicine combines two types of female hormones, an estrogen and a progestin. This patch is used to prevent ovulation and pregnancy. This medicine may be used for other purposes; ask your health care provider or pharmacist if you have questions. COMMON BRAND NAME(S): Ortho Becky Sax What should I tell my health care provider before I take this medicine? They need to know if you have or ever had any of these conditions:  abnormal vaginal bleeding  blood vessel disease or blood clots  breast, cervical, endometrial, ovarian, liver, or uterine cancer  diabetes  gallbladder disease  having surgery  heart disease or recent heart attack  high blood pressure  high cholesterol or triglycerides  history of irregular heartbeat or heart valve problems  kidney disease  liver disease  migraine headaches  protein C deficiency  protein S deficiency  recently had a baby, miscarriage, or abortion  stroke  systemic lupus erythematosus (SLE)  tobacco smoker  an unusual or allergic reaction to estrogens, progestins, other medicines, foods, dyes, or preservatives  pregnant or trying to get pregnant  breast-feeding How should I use this medicine? This patch is applied to the skin. Follow the directions on the prescription label. Apply to clean, dry, healthy skin on the buttock, abdomen, upper outer arm or upper torso, in a place where it will not be rubbed by tight clothing. Do not use lotions or other cosmetics on the site where the patch will go. Press the patch firmly in place for 10 seconds to ensure good contact with the skin. Change the patch every 7 days on the same day of the week for 3 weeks. You will then have a break from the patch for 1 week, after which you  will apply a new patch. Do not use your medicine more often than directed. Contact your pediatrician regarding the use of this medicine in children. Special care may be needed. This medicine has been used in female children who have started having menstrual periods. A patient package insert for the product will be given with each prescription and refill. Read this sheet carefully each time. The sheet may change frequently. Overdosage: If you think you have taken too much of this medicine contact a poison control center or emergency room at once. NOTE: This medicine is only for you. Do not share this medicine with others. What if I miss a dose? You will need to replace your patch once a week as directed. If your patch is lost or falls off, contact your health care professional for advice. You may need to use another form of birth control if your patch has been off for more than 1 day. What may interact with this medicine? Do not take this medicine with the following medications:  dasabuvir; ombitasvir; paritaprevir; ritonavir  ombitasvir; paritaprevir; ritonavir This medicine may also interact with the following medications:  acetaminophen  antibiotics or medicines for infections, especially rifampin, rifabutin, rifapentine, and possibly penicillins or tetracyclines  aprepitant or fosaprepitant  armodafinil  ascorbic acid (vitamin C)  barbiturate medicines, such as phenobarbital or primidone  bosentan  certain antiviral medicines for hepatitis, HIV or AIDS  certain medicines for cancer treatment  certain medicines for seizures like carbamazepine, clobazam, felbamate, lamotrigine, oxcarbazepine, phenytoin, rufinamide, topiramate  certain medicines for treating high cholesterol  cyclosporine  dantrolene  elagolix  flibanserin  grapefruit juice  lesinurad  medicines for diabetes  medicines to treat fungal infections, such as griseofulvin, miconazole, fluconazole,  ketoconazole, itraconazole, posaconazole or voriconazole  mifepristone  mitotane  modafinil  morphine  mycophenolate  St. John's wort  tamoxifen  temazepam  theophylline or aminophylline  thyroid hormones  tizanidine  tranexamic acid  ulipristal  warfarin This list may not describe all possible interactions. Give your health care provider a list of all the medicines, herbs, non-prescription drugs, or dietary supplements you use. Also tell them if you smoke, drink alcohol, or use illegal drugs. Some items may interact with your medicine. What should I watch for while using this medicine? Visit your doctor or health care professional for regular checks on your progress. You will need a regular breast and pelvic exam and Pap smear while on this medicine. Use an additional method of contraception during the first cycle that you use this patch. If you have any reason to think you are pregnant, stop using this medicine right away and contact your doctor or health care professional. If you are using this medicine for hormone related problems, it may take several cycles of use to see improvement in your condition. Smoking increases the risk of getting a blood clot or having a stroke while you are using hormonal birth control, especially if you are more than 45 years old. You are strongly advised not to smoke. This medicine can make your body retain fluid, making your fingers, hands, or ankles swell. Your blood pressure can go up. Contact your doctor or health care professional if you feel you are retaining fluid. This medicine can make you more sensitive to the sun. Keep out of the sun. If you cannot avoid being in the sun, wear protective clothing and use sunscreen. Do not use sun lamps or tanning beds/booths. If you wear contact lenses and notice visual changes, or if the lenses begin to feel uncomfortable, consult your eye care specialist. In some women, tenderness, swelling, or  minor bleeding of the gums may occur. Notify your dentist if this happens. Brushing and flossing your teeth regularly may help limit this. See your dentist regularly and inform your dentist of the medicines you are taking. If you are going to have elective surgery or a MRI, you may need to stop using this medicine before the surgery or MRI. Consult your health care professional for advice. This medicine does not protect you against HIV infection (AIDS) or any other sexually transmitted diseases. What side effects may I notice from receiving this medicine? Side effects that you should report to your doctor or health care professional as soon as possible:  allergic reactions such as skin rash or itching, hives, swelling of the lips, mouth, tongue, or throat  breast tissue changes or discharge  dark patches of skin on your forehead, cheeks, upper lip, and chin  depression  high blood pressure  migraines or severe, sudden headaches  missed menstrual periods  signs and symptoms of a blood clot such as breathing problems; changes in vision; chest pain; severe, sudden headache; pain, swelling, warmth in the leg; trouble speaking; sudden numbness or weakness of the face, arm or leg  skin reactions at the patch site such as blistering, bleeding, itching, rash, or swelling  stomach pain  yellowing of the eyes or skin Side effects that usually do not require medical attention (report these to your doctor or health care professional if they continue or are bothersome):  breast tenderness  irregular vaginal bleeding or spotting, particularly during the first 3 months of use  headache  nausea  painful menstrual periods  skin redness or mild irritation at site where applied  weight gain (slight) This list may not describe all possible side effects. Call your doctor for medical advice about side effects. You may report side effects to FDA at 1-800-FDA-1088. Where should I keep my  medicine? Keep out of the reach of children. Store at room temperature between 15 and 30 degrees C (59 and 86 degrees F). Keep the patch in its pouch until time of use. Throw away any unused medicine after the expiration date. Dispose of used patches properly. Since a used patch may still contain active hormones, fold the patch in half so that it sticks to itself prior to disposal. Throw away in a place where children or pets cannot reach. NOTE: This sheet is a summary. It may not cover all possible information. If you have questions about this medicine, talk to your doctor, pharmacist, or health care provider.  2020 Elsevier/Gold Standard (2019-03-22 11:56:29)   Perimenopause  Perimenopause is the normal time of life before and after menstrual periods stop completely (menopause). Perimenopause can begin 2-8 years before menopause, and it usually lasts for 1 year after menopause. During perimenopause, the ovaries may or may not produce an egg. What are the causes? This condition is caused by a natural change in hormone levels that happens as you get older. What increases the risk? This condition is more likely to start at an earlier age if you have certain medical conditions or treatments, including:  A tumor of the pituitary gland in the brain.  A disease that affects the ovaries and hormone production.  Radiation treatment for cancer.  Certain cancer treatments, such as chemotherapy or hormone (anti-estrogen) therapy.  Heavy smoking and excessive alcohol use.  Family history of early menopause. What are the signs or symptoms? Perimenopausal changes affect each woman differently. Symptoms of this condition may include:  Hot flashes.  Night sweats.  Irregular menstrual periods.  Decreased sex drive.  Vaginal dryness.  Headaches.  Mood swings.  Depression.  Memory problems or trouble concentrating.  Irritability.  Tiredness.  Weight gain.  Anxiety.  Trouble  getting pregnant. How is this diagnosed? This condition is diagnosed based on your medical history, a physical exam, your age, your menstrual history, and your symptoms. Hormone tests may also be done. How is this treated? In some cases, no treatment is needed. You and your health care provider should make a decision together about whether treatment is necessary. Treatment will be based on your individual condition and preferences. Various treatments are available, such as:  Menopausal hormone therapy (MHT).  Medicines to treat specific symptoms.  Acupuncture.  Vitamin or herbal supplements. Before starting treatment, make sure to let your health care provider know if you have a personal or family history of:  Heart disease.  Breast cancer.  Blood clots.  Diabetes.  Osteoporosis. Follow these instructions at home: Lifestyle  Do not use any products that contain nicotine or tobacco, such as cigarettes and e-cigarettes. If you need help quitting, ask your health care provider.  Eat a balanced diet that includes fresh fruits and vegetables, whole grains, soybeans, eggs, lean meat, and low-fat dairy.  Get at least 30 minutes of physical activity on 5 or more days each week.  Avoid alcoholic and caffeinated beverages, as well as spicy foods. This may help prevent hot flashes.  Get 7-8 hours of sleep each night.  Dress in layers that can be removed to help you manage hot flashes.  Find ways to manage stress, such as deep breathing, meditation, or journaling. General instructions  Keep track of your menstrual periods, including: ? When they occur. ? How heavy they are and how long they last. ? How much time passes between periods.  Keep track of your symptoms, noting when they start, how often you have them, and how long they last.  Take over-the-counter and prescription medicines only as told by your health care provider.  Take vitamin supplements only as told by your  health care provider. These may include calcium, vitamin E, and vitamin D.  Use vaginal lubricants or moisturizers to help with vaginal dryness and improve comfort during sex.  Talk with your health care provider before starting any herbal supplements.  Keep all follow-up visits as told by your health care provider. This is important. This includes any group therapy or counseling. Contact a health care provider if:  You have heavy vaginal bleeding or pass blood clots.  Your period lasts more than 2 days longer than normal.  Your periods are recurring sooner than 21 days.  You bleed after having sex. Get help right away if:  You have chest pain, trouble breathing, or trouble talking.  You have severe depression.  You have pain when you urinate.  You have severe headaches.  You have vision problems. Summary  Perimenopause is the time when a woman's body begins to move into menopause. This may happen naturally or as a result of other health problems or medical treatments.  Perimenopause can begin 2-8 years before menopause, and it usually lasts for 1 year after menopause.  Perimenopausal symptoms can be managed through medicines, lifestyle changes, and complementary therapies such as acupuncture. This information is not intended to replace advice given to you by your health care provider. Make sure you discuss any questions you have with your health care provider. Document Released: 01/22/2005 Document Revised: 11/27/2017 Document Reviewed: 01/20/2017 Elsevier Patient Education  2020 [employer].

## 2019-07-08 NOTE — Progress Notes (Signed)
GYN ENCOUNTER NOTE  Subjective:       Kaitlyn Massey is a 43 y.o. G0P0000 female is here for gynecologic evaluation of the following issues:  1. Spotting between menses 2. Hot flashes 3. Weight gain  Reports symptoms on and off for the last few months. Notices occasional night sweats and hot flashes as well.   Has used Xulane for the last four (4) years. No another form of contraception at this time.   Denies difficulty breathing or respiratory distress, chest pain, abdominal pain, excessive vaginal bleeding, dysuria, and leg pain or swelling.    Gynecologic History  Patient's last menstrual period was 06/22/2019 (approximate).  Contraception: Ortho-Evra patches weekly  Last Pap: 12/2018. Results were: normal  Last mammogram: 07/2018. Results were: normal  Obstetric History OB History  Gravida Para Term Preterm AB Living  0 0 0 0 0 0  SAB TAB Ectopic Multiple Live Births  0 0 0 0 0    Past Medical History:  Diagnosis Date  . Arthritis   . Depression   . GERD (gastroesophageal reflux disease)     Past Surgical History:  Procedure Laterality Date  . None      Current Outpatient Medications on File Prior to Visit  Medication Sig Dispense Refill  . Azelastine-Fluticasone (DYMISTA) 137-50 MCG/ACT SUSP Dymista 137 mcg-50 mcg/spray nasal spray    . fenofibrate (TRICOR) 145 MG tablet     . levothyroxine (SYNTHROID) 50 MCG tablet Take 50 mcg by mouth daily before breakfast.    . norelgestromin-ethinyl estradiol Marilu Favre) 150-35 MCG/24HR transdermal patch Xulane 150 mcg-35 mcg/24 hr transdermal patch    . omeprazole (PRILOSEC) 20 MG capsule Take 1 capsule (20 mg total) by mouth daily. Take 30 minutes prior to breakfast 30 capsule 3  . rosuvastatin (CRESTOR) 20 MG tablet     . venlafaxine XR (EFFEXOR-XR) 37.5 MG 24 hr capsule     . vitamin C (ASCORBIC ACID) 500 MG tablet Take 500 mg by mouth daily.    . Chromium 200 MCG CAPS Take by mouth.    . EUFLEXXA 20 MG/2ML SOSY      . methimazole (TAPAZOLE) 5 MG tablet Take 1 tablet by mouth daily.    . montelukast (SINGULAIR) 10 MG tablet Take 10 mg by mouth at bedtime.    . Omega-3 350 MG CPDR Take by mouth.     No current facility-administered medications on file prior to visit.     Allergies  Allergen Reactions  . No Known Allergies     Social History   Socioeconomic History  . Marital status: Single    Spouse name: Not on file  . Number of children: Not on file  . Years of education: Not on file  . Highest education level: Not on file  Occupational History  . Not on file  Social Needs  . Financial resource strain: Not on file  . Food insecurity    Worry: Not on file    Inability: Not on file  . Transportation needs    Medical: Not on file    Non-medical: Not on file  Tobacco Use  . Smoking status: Never Smoker  . Smokeless tobacco: Never Used  Substance and Sexual Activity  . Alcohol use: No  . Drug use: No  . Sexual activity: Not Currently    Birth control/protection: Patch  Lifestyle  . Physical activity    Days per week: Not on file    Minutes per session: Not on file  .  Stress: Not on file  Relationships  . Social Herbalist on phone: Not on file    Gets together: Not on file    Attends religious service: Not on file    Active member of club or organization: Not on file    Attends meetings of clubs or organizations: Not on file    Relationship status: Not on file  . Intimate partner violence    Fear of current or ex partner: Not on file    Emotionally abused: Not on file    Physically abused: Not on file    Forced sexual activity: Not on file  Other Topics Concern  . Not on file  Social History Narrative  . Not on file    Family History  Problem Relation Age of Onset  . Breast cancer Maternal Aunt   . Colon cancer Neg Hx   . Bladder Cancer Neg Hx   . Kidney cancer Neg Hx     The following portions of the patient's history were reviewed and updated as  appropriate: allergies, current medications, past family history, past medical history, past social history, past surgical history and problem list.  Review of Systems  ROS negative except as noted above. Information obtained from patient.   Objective:   BP 126/79   Pulse 71   Ht 5' 4"  (1.626 m)   Wt 269 lb 4 oz (122.1 kg)   LMP 06/22/2019 (Approximate)   BMI 46.22 kg/m   CONSTITUTIONAL: Well-developed, well-nourished female in no acute distress.    ABDOMEN: Soft, non distended; Non tender.  No Organomegaly. Obese  PELVIC:  External Genitalia: Normal  BUS: Normal  Vagina: Normal  Cervix: Normal, Vaginal swab collected   MUSCULOSKELETAL: Normal range of motion. No tenderness.  No cyanosis, clubbing, or edema.  Recent Results (from the past 2160 hour(s))  Estradiol     Status: None   Collection Time: 07/08/19  9:47 AM  Result Value Ref Range   Estradiol 536.9 pg/mL    Comment:                     Adult Female:                       Follicular phase   32.9 -   166.0                       Ovulation phase    85.8 -   498.0                       Luteal phase       43.8 -   211.0                       Postmenopausal     <6.0 -    54.7                     Pregnancy                       1st trimester     215.0 - >4300.0                     Girls (1-10 years)    6.0 -    27.0 Roche ECLIA methodology   FSH/LH     Status: None   Collection  Time: 07/08/19  9:47 AM  Result Value Ref Range   LH 3.5 mIU/mL    Comment:                     Adult Female:                       Follicular phase      2.4 -  12.6                       Ovulation phase      14.0 -  95.6                       Luteal phase          1.0 -  11.4                       Postmenopausal        7.7 -  58.5    FSH 1.5 mIU/mL    Comment:                     Adult Female:                       Follicular phase      3.5 -  12.5                       Ovulation phase       4.7 -  21.5                       Luteal  phase          1.7 -   7.7                       Postmenopausal       25.8 - 134.8   Thyroid Panel With TSH     Status: Abnormal   Collection Time: 07/08/19  9:47 AM  Result Value Ref Range   TSH 2.900 0.450 - 4.500 uIU/mL   T4, Total 8.1 4.5 - 12.0 ug/dL   T3 Uptake Ratio 16 (L) 24 - 39 %   Free Thyroxine Index 1.3 1.2 - 4.9    Assessment:   1. Spotting between menses  - Estradiol - FSH/LH - Thyroid Panel With TSH - NuSwab Vaginitis (VG)  2. Weight gain  - Estradiol - FSH/LH - Thyroid Panel With TSH  3. Hot flashes  - Estradiol - FSH/LH - Thyroid Panel With TSH   Plan:   Thyroid levels managed by Endocrinology.  Labs today, see orders will contact patient with results.   Advised of Xulane weight limit is 195-199 lbs, patient desires to continue with this method.   Reviewed red flag symptoms and when to call.   RTC as needed.    Diona Fanti, CNM [facility], [facility]

## 2019-07-09 LAB — THYROID PANEL WITH TSH
Free Thyroxine Index: 1.3 (ref 1.2–4.9)
T3 Uptake Ratio: 16 % — ABNORMAL LOW (ref 24–39)
T4, Total: 8.1 ug/dL (ref 4.5–12.0)
TSH: 2.9 u[IU]/mL (ref 0.450–4.500)

## 2019-07-09 LAB — FSH/LH
FSH: 1.5 m[IU]/mL
LH: 3.5 m[IU]/mL

## 2019-07-09 LAB — ESTRADIOL: Estradiol: 536.9 pg/mL

## 2019-07-13 LAB — SPECIMEN STATUS REPORT

## 2019-07-13 LAB — BETA HCG QUANT (REF LAB): hCG Quant: <1 m[IU]/mL

## 2019-07-18 LAB — NUSWAB VAGINITIS (VG)
Candida albicans, NAA: NEGATIVE
Candida glabrata, NAA: NEGATIVE
Trich vag by NAA: NEGATIVE

## 2019-07-18 NOTE — Progress Notes (Signed)
Please contact patient. Overall, labs were normal. Estradiol elevated, should schedule pelvic ultrasound to check ovaries. Encourage to activate MyChart. Thanks, JML

## 2019-07-29 ENCOUNTER — Ambulatory Visit
Admission: RE | Admit: 2019-07-29 | Discharge: 2019-07-29 | Disposition: A | Discharge status: Home / Self Care | Payer: 59 | Source: Ambulatory Visit | Attending: Nurse Practitioner | Admitting: Nurse Practitioner

## 2019-07-29 DIAGNOSIS — Z1231 Encounter for screening mammogram for malignant neoplasm of breast: Secondary | ICD-10-CM | POA: Insufficient documentation

## 2019-08-02 ENCOUNTER — Ambulatory Visit (INDEPENDENT_AMBULATORY_CARE_PROVIDER_SITE_OTHER): Payer: 59

## 2019-08-02 ENCOUNTER — Other Ambulatory Visit: Payer: Self-pay | Admitting: Certified Nurse Midwife

## 2019-08-02 ENCOUNTER — Other Ambulatory Visit: Payer: Self-pay

## 2019-08-02 DIAGNOSIS — R52 Pain, unspecified: Secondary | ICD-10-CM | POA: Diagnosis not present

## 2020-06-19 ENCOUNTER — Other Ambulatory Visit: Payer: Self-pay | Admitting: Nurse Practitioner

## 2020-06-19 DIAGNOSIS — Z1231 Encounter for screening mammogram for malignant neoplasm of breast: Secondary | ICD-10-CM

## 2020-08-03 ENCOUNTER — Ambulatory Visit
Admission: RE | Admit: 2020-08-03 | Discharge: 2020-08-03 | Disposition: A | Discharge status: Home / Self Care | Payer: No Typology Code available for payment source | Source: Ambulatory Visit | Attending: Nurse Practitioner | Admitting: Nurse Practitioner

## 2020-08-03 ENCOUNTER — Other Ambulatory Visit: Payer: Self-pay

## 2020-08-03 DIAGNOSIS — Z1231 Encounter for screening mammogram for malignant neoplasm of breast: Secondary | ICD-10-CM | POA: Insufficient documentation

## 2021-02-11 ENCOUNTER — Other Ambulatory Visit: Payer: Self-pay

## 2021-02-11 ENCOUNTER — Ambulatory Visit: Payer: No Typology Code available for payment source | Admitting: Dermatology

## 2021-02-11 DIAGNOSIS — L811 Chloasma: Secondary | ICD-10-CM

## 2021-02-11 DIAGNOSIS — L74519 Primary focal hyperhidrosis, unspecified: Secondary | ICD-10-CM

## 2021-02-11 DIAGNOSIS — L74510 Primary focal hyperhidrosis, axilla: Secondary | ICD-10-CM

## 2021-02-11 MED ORDER — HYDROQUINONE 4 % EX CREA: TOPICAL_CREAM | CUTANEOUS | 0 refills | Status: DC

## 2021-02-11 MED ORDER — DRYSOL 20 % EX SOLN: CUTANEOUS | 3 refills | Status: DC

## 2021-02-11 NOTE — Patient Instructions (Addendum)
Melasma is a condition of persistent pigmented patches generally on the face, worse in summer due to higher UV exposure.  Oral estrogen containing BCPs or supplements can exacerbate condition.  Recommend daily broad spectrum tinted sunscreen SPF 30+ to face, preferably with Zinc or Titanium Dioxide. Discussed Rx topical bleaching creams (i.e. hydroquinone), OTC HelioCare supplement, chemical peels (would need multiple for best result).   Start hydroquinone 4% cream - Apply to dark spots on forehead twice a day. Recommend taking a break after 2-3 months of use.  Start Drysol - Apply to underarms 3 nights a week, increasing to nightly as tolerated.

## 2021-02-11 NOTE — Progress Notes (Signed)
   New Patient Visit  Subjective  Kaitlyn Massey is a 45 y.o. female who presents for the following: Skin Problem.  Patient here today for hyperpigmentation on the forehead x 1 year.  Similar problem runs in the family.  She does take use an estrogen containing patch for birth control She also has excessive sweating of the underarms, face, and upper thighs. She has tried Secret Clinical without improvement. She has very sensitive skin.  The following portions of the chart were reviewed this encounter and updated as appropriate:       Review of Systems:  No other skin or systemic complaints except as noted in HPI or Assessment and Plan.  Objective  Well appearing patient in no apparent distress; mood and affect are within normal limits.  A focused examination was performed including face, trunk. Relevant physical exam findings are noted in the Assessment and Plan.  Objective  Bil Axilla, face, upper thighs/hips: Excessive sweating.  Objective  Forehead: Reticulated hyperpigmented patches.   Images     Assessment & Plan  Primary focal hyperhidrosis Bil Axilla, face, upper thighs/hips  Discussed treatment options including Drysol and oral Robinul and side effects in detail. Discussed Botox injections for forehead, not covered by insurance.  Start Drysol Solution Apply qhs 3x/wk to underarms, increasing to nightly as tolerated.  Discussed potential skin irritation  If not improving with topical, will consider starting Robinul.  aluminum chloride (DRYSOL) 20 % external solution - Bil Axilla, face, upper thighs/hips  Melasma Forehead  Melasma is a condition of persistent pigmented patches generally on the face, worse in summer due to higher UV exposure.  Oral estrogen containing BCPs or supplements can exacerbate condition.  Recommend daily broad spectrum tinted sunscreen SPF 30+ to face, preferably with Zinc or Titanium Dioxide. Discussed Rx topical bleaching creams  (i.e. hydroquinone), OTC HelioCare supplement, chemical peels (would need multiple for best result).   Start hydroquinone 4% cream Apply to forehead BID until improved dsp 28.35g 0Rf. Consider changing birth control to one that doesn't contain estrogen  Samples of LaRoche Posay Tinted and elta md Clear spf 46 tinted.  Apply qam During spring/summer- Rec Heliocare 2 caps PO qam, and mid day, when out in sun all day  hydroquinone 4 % cream - Forehead  Return in about 2 months (around 04/11/2021) for Melasma, Hyperhidrosis.   IJamesetta Orleans, CMA, am acting as scribe for Brendolyn Patty, MD .  Documentation: I have reviewed the above documentation for accuracy and completeness, and I agree with the above.  Brendolyn Patty MD

## 2021-05-07 ENCOUNTER — Other Ambulatory Visit: Payer: Self-pay

## 2021-05-07 ENCOUNTER — Other Ambulatory Visit: Payer: Self-pay | Admitting: Dermatology

## 2021-05-07 ENCOUNTER — Ambulatory Visit: Payer: No Typology Code available for payment source | Admitting: Dermatology

## 2021-05-07 DIAGNOSIS — L74510 Primary focal hyperhidrosis, axilla: Secondary | ICD-10-CM

## 2021-05-07 DIAGNOSIS — L811 Chloasma: Secondary | ICD-10-CM

## 2021-05-07 DIAGNOSIS — L74519 Primary focal hyperhidrosis, unspecified: Secondary | ICD-10-CM

## 2021-05-07 MED ORDER — HYDROQUINONE 4 % EX CREA: TOPICAL_CREAM | CUTANEOUS | 1 refills | Status: DC

## 2021-05-07 MED ORDER — PIMECROLIMUS 1 % EX CREA: TOPICAL_CREAM | Freq: Two times a day (BID) | CUTANEOUS | 3 refills | Status: DC

## 2021-05-07 NOTE — Progress Notes (Signed)
   Follow-Up Visit   Subjective  Kaitlyn Massey is a 45 y.o. female who presents for the following: 2 month follow up  (Patient here today for 2 month follow up for melasma and hyperhidrosis. Patient states she feels melasma has improved since using hydroquinone 4 % cream. She states hyperhidrosis has gotten better since using drysol solution. Her only concern is that it causes some itching. ). She can't use it very often due to skin irritation  The following portions of the chart were reviewed this encounter and updated as appropriate:       Objective  Well appearing patient in no apparent distress; mood and affect are within normal limits.  A focused examination was performed including forehead and bilateral axilla . Relevant physical exam findings are noted in the Assessment and Plan.  Objective  forehead: Reticulated hyperpigmented patches with fading when compared to photos  Objective  Right Axilla: pt defers exam today- states she is better  Assessment & Plan  Melasma forehead  Chronic condition with duration or expected duration over one year. Condition is bothersome to patient. Some improvement noted  Melasma is a condition of persistent pigmented patches generally on the face, worse in summer due to higher UV exposure.  Oral estrogen containing BCPs or supplements can exacerbate condition.  Recommend daily broad spectrum tinted sunscreen SPF 30+ to face, preferably with Zinc or Titanium Dioxide. Discussed Rx topical bleaching creams (i.e. hydroquinone), OTC HelioCare supplement, chemical peels (would need multiple for best result).   Continue hydroquinone 4 % cream to dark spots on forehead twice daily and before applying sunscreen, take 1 mo break from applying after 3 months total.  May apply InkeyTranexamic acid cream qhs  Recommend spf 30+ every morning to help prevent darkening during summer   Recommend Elta md clear tinted  Reordered Medications hydroquinone  4 % cream  Primary focal hyperhidrosis Right Axilla  Chronic condition- Improving, but with resulting skin irritation/itch with regular use of Drysol  Continue drysol 20 % external solution - apply to underarms 3 nights a week, increasing to nightly as tolerated, pt has rfs  Start Pimecrolimus 1 % cream apply topically 2 times daily to underarms as needed for itchy rash.      pimecrolimus (ELIDEL) 1 % cream - Right Axilla  Other Related Medications aluminum chloride (DRYSOL) 20 % external solution  Return in about 4 months (around 09/07/2021) for melasma and hyperhidrosis .  I, Ruthell Rummage, CMA, am acting as scribe for Brendolyn Patty, MD.  Documentation: I have reviewed the above documentation for accuracy and completeness, and I agree with the above.  Brendolyn Patty MD

## 2021-05-07 NOTE — Patient Instructions (Addendum)
Hydroquinone 4 % cream - take a break for a month and half and then can restart as needed    inkey tranexamic acid night cream - can purchase at [facility] - can use when taking a break from hydroquinone 4 % cream   Melasma is a condition of persistent pigmented patches generally on the face, worse in summer due to higher UV exposure.  Oral estrogen containing BCPs or supplements can exacerbate condition.  Recommend daily broad spectrum tinted sunscreen SPF 30+ to face, preferably with Zinc or Titanium Dioxide. Discussed Rx topical bleaching creams (i.e. hydroquinone), OTC HelioCare supplement, chemical peels (would need multiple for best result).   Recommend daily broad spectrum sunscreen SPF 30+ to sun-exposed areas, reapply every 2 hours as needed. Call for new or changing lesions.  Staying in the shade or wearing long sleeves, sun glasses (UVA+UVB protection) and wide brim hats (4-inch brim around the entire circumference of the hat) are also recommended for sun protection.    If you have any questions or concerns for your doctor, please call our main line at [phone] and press option 4 to reach your doctor's medical assistant. If no one answers, please leave a voicemail as directed and we will return your call as soon as possible. Messages left after 4 pm will be answered the following business day.   You may also send [place] a message via [place]. We typically respond to MyChart messages within 1-2 business days.  For prescription refills, please ask your pharmacy to contact our office. Our fax number is [phone].  If you have an urgent issue when the clinic is closed that cannot wait until the next business day, you can page your doctor at the number below.    Please note that while we do our best to be available for urgent issues outside of office hours, we are not available 24/7.   If you have an urgent issue and are unable to reach [place], you may choose to seek medical care at your  doctor's office, retail clinic, urgent care center, or emergency room.  If you have a medical emergency, please immediately call 911 or go to the emergency department.  Pager Numbers  - Dr. Nehemiah Massed: [phone]  - Dr. Laurence Ferrari: [phone]  - Dr. Nicole Kindred: [phone]  In the event of inclement weather, please call our main line at [phone] for an update on the status of any delays or closures.  Dermatology Medication Tips: Please keep the boxes that topical medications come in in order to help keep track of the instructions about where and how to use these. Pharmacies typically print the medication instructions only on the boxes and not directly on the medication tubes.   If your medication is too expensive, please contact our office at [phone] option 4 or send [place] a message through [place].   We are unable to tell what your co-pay for medications will be in advance as this is different depending on your insurance coverage. However, we may be able to find a substitute medication at lower cost or fill out paperwork to get insurance to cover a needed medication.   If a prior authorization is required to get your medication covered by your insurance company, please allow [place] 1-2 business days to complete this process.  Drug prices often vary depending on where the prescription is filled and some pharmacies may offer cheaper prices.  The website www.goodrx.com contains coupons for medications through different pharmacies. The prices here do not account for what  the cost may be with help from insurance (it may be cheaper with your insurance), but the website can give you the price if you did not use any insurance.  - You can print the associated coupon and take it with your prescription to the pharmacy.  - You may also stop by our office during regular business hours and pick up a GoodRx coupon card.  - If you need your prescription sent electronically to a different pharmacy, notify  our office through [facility] or by phone at [phone] option 4.

## 2021-05-17 ENCOUNTER — Telehealth: Payer: Self-pay

## 2021-05-17 DIAGNOSIS — L309 Dermatitis, unspecified: Secondary | ICD-10-CM

## 2021-05-17 NOTE — Telephone Encounter (Signed)
Elidel not covered. OK to send Protopic?

## 2021-05-20 MED ORDER — TACROLIMUS 0.1 % EX OINT: TOPICAL_OINTMENT | Freq: Two times a day (BID) | CUTANEOUS | 3 refills | Status: DC

## 2021-05-20 NOTE — Telephone Encounter (Signed)
Left message on voicemail to return my call.

## 2021-06-21 ENCOUNTER — Other Ambulatory Visit: Payer: Self-pay | Admitting: Nurse Practitioner

## 2021-06-21 DIAGNOSIS — Z1231 Encounter for screening mammogram for malignant neoplasm of breast: Secondary | ICD-10-CM

## 2021-08-02 ENCOUNTER — Ambulatory Visit: Payer: No Typology Code available for payment source | Admitting: Obstetrics and Gynecology

## 2021-08-02 ENCOUNTER — Other Ambulatory Visit: Payer: Self-pay

## 2021-08-02 ENCOUNTER — Encounter: Payer: Self-pay | Admitting: Obstetrics and Gynecology

## 2021-08-02 VITALS — BP 130/72 | Ht 64.0 in | Wt 275.0 lb

## 2021-08-02 DIAGNOSIS — Z124 Encounter for screening for malignant neoplasm of cervix: Secondary | ICD-10-CM | POA: Diagnosis not present

## 2021-08-02 DIAGNOSIS — N951 Menopausal and female climacteric states: Secondary | ICD-10-CM

## 2021-08-02 DIAGNOSIS — N852 Hypertrophy of uterus: Secondary | ICD-10-CM

## 2021-08-02 DIAGNOSIS — Z1231 Encounter for screening mammogram for malignant neoplasm of breast: Secondary | ICD-10-CM | POA: Diagnosis not present

## 2021-08-02 DIAGNOSIS — Z01419 Encounter for gynecological examination (general) (routine) without abnormal findings: Secondary | ICD-10-CM

## 2021-08-02 DIAGNOSIS — Z0000 Encounter for general adult medical examination without abnormal findings: Secondary | ICD-10-CM

## 2021-08-02 DIAGNOSIS — Z1211 Encounter for screening for malignant neoplasm of colon: Secondary | ICD-10-CM

## 2021-08-02 NOTE — Progress Notes (Signed)
Patient ID: Kaitlyn Massey, female   DOB: [birth year], 45 y.o.   MRN: [id number]  Reason for Consult: Gynecologic Exam   Referred by Danelle Berry, NP  Subjective:     HPI:  Kaitlyn Massey is a 45 y.o. female she presents today for consultation regarding menopause.  She reports that she has been having itching especially across her upper body since June.  She reports it feels like she is getting stuck all over by small pins.  She has had some sweating at night.  She reports that she changed to a gentler detergent without significant improvement in her symptoms.  She wonders if this itching is a symptom of menopause.  Gynecological History  Patient's last menstrual period was 07/17/2021. Menarche: 6  She denies passage of large clots She denies sensations of gushing or flooding of blood. She denies accidents where she bleeds through her clothing. She denies that she changes a saturated pad or tampon more frequently than every hour.  She denies that pain from her periods limits her activities.  History of fibroids, polyps, or ovarian cysts? : yes  History of PCOS? no Hstory of Endometriosis? no History of abnormal pap smears? no Have you had any sexually transmitted infections in the past? no  Last Pap: uncertain  She identifies as a female. She is not sexually active with men.   She denies dyspareunia.   Past Medical History:  Diagnosis Date   Arthritis    Depression    GERD (gastroesophageal reflux disease)    Family History  Problem Relation Age of Onset   Breast cancer Maternal Aunt    Colon cancer Neg Hx    Bladder Cancer Neg Hx    Kidney cancer Neg Hx    Past Surgical History:  Procedure Laterality Date   None      Short Social History:  Social History   Tobacco Use   Smoking status: Never   Smokeless tobacco: Never  Substance Use Topics   Alcohol use: No    Allergies  Allergen Reactions   No Known Allergies     Current Outpatient  Medications  Medication Sig Dispense Refill   aluminum chloride (DRYSOL) 20 % external solution Apply to underarms 3 nights a week, increasing to nightly as tolerated. 60 mL 3   Azelastine-Fluticasone 137-50 MCG/ACT SUSP Dymista 137 mcg-50 mcg/spray nasal spray     chlorthalidone (HYGROTON) 25 MG tablet TAKE 1 TABLET BY MOUTH DAILY IN THE MORNING     Chromium 200 MCG CAPS Take by mouth.     EUFLEXXA 20 MG/2ML SOSY      fenofibrate (TRICOR) 145 MG tablet      hydroquinone 4 % cream Apply to dark spots on forehead twice daily until improved. 28.35 g 1   levothyroxine (SYNTHROID) 50 MCG tablet Take 50 mcg by mouth daily before breakfast.     methimazole (TAPAZOLE) 5 MG tablet Take 1 tablet by mouth daily.     norelgestromin-ethinyl estradiol Marilu Favre) 150-35 MCG/24HR transdermal patch Xulane 150 mcg-35 mcg/24 hr transdermal patch     Omega-3 350 MG CPDR Take by mouth.     omeprazole (PRILOSEC) 20 MG capsule Take 1 capsule (20 mg total) by mouth daily. Take 30 minutes prior to breakfast 30 capsule 3   pimecrolimus (ELIDEL) 1 % cream Apply topically 2 (two) times daily. Apply to underarms as needed for itchy rash 30 g 3   potassium chloride (KLOR-CON) 10 MEQ tablet Take 10 mEq by  mouth every morning.     tacrolimus (PROTOPIC) 0.1 % ointment Apply topically 2 (two) times daily. Apply to aa's rash on axilla QD-BID PRN. 60 g 3   venlafaxine XR (EFFEXOR-XR) 37.5 MG 24 hr capsule      vitamin C (ASCORBIC ACID) 500 MG tablet Take 500 mg by mouth daily.     No current facility-administered medications for this visit.    Review of Systems  Constitutional: Negative for chills, fatigue, fever and unexpected weight change.  HENT: Negative for trouble swallowing.  Eyes: Negative for loss of vision.  Respiratory: Negative for cough, shortness of breath and wheezing.  Cardiovascular: Negative for chest pain, leg swelling, palpitations and syncope.  GI: Negative for abdominal pain, blood in stool, diarrhea,  nausea and vomiting.  GU: Negative for difficulty urinating, dysuria, frequency and hematuria.  Musculoskeletal: Positive for joint pain. Negative for back pain and leg pain.  Skin: Negative for rash.  Neurological: Negative for dizziness, headaches, light-headedness, numbness and seizures.  Psychiatric: Negative for behavioral problem, confusion, depressed mood and sleep disturbance.       Objective:  Objective   Vitals:   08/02/21 1423  BP: 130/72  Weight: 275 lb (124.7 kg)  Height: 5' 4"  (1.626 m)   Body mass index is 47.2 kg/m.  Physical Exam Vitals and nursing note reviewed. Exam conducted with a chaperone present.  Constitutional:      Appearance: Normal appearance. She is well-developed.  HENT:     Head: Normocephalic and atraumatic.  Eyes:     Extraocular Movements: Extraocular movements intact.     Pupils: Pupils are equal, round, and reactive to light.  Cardiovascular:     Rate and Rhythm: Normal rate and regular rhythm.  Pulmonary:     Effort: Pulmonary effort is normal. No respiratory distress.     Breath sounds: Normal breath sounds.  Abdominal:     General: Abdomen is flat.     Palpations: Abdomen is soft.  Genitourinary:    Comments: External: Normal appearing vulva. No lesions noted.  Speculum examination: Normal appearing cervix. No blood in the vaginal vault. No discharge.   Bimanual examination: Uterus midline, enlarged bulky size.  No CMT. No adnexal masses. No adnexal tenderness. Pelvis not fixed.  Breast exam: breast equal without skin changes, nipple discharge, breast lump or enlarged lymph nodes Musculoskeletal:        General: No signs of injury.  Skin:    General: Skin is warm and dry.  Neurological:     Mental Status: She is alert and oriented to person, place, and time.  Psychiatric:        Behavior: Behavior normal.        Thought Content: Thought content normal.        Judgment: Judgment normal.    Assessment/Plan:     45 yo   Discussed menopause.  Discussed that the average age of menopause is 81.  Discussed that since she is having regular monthly menstrual cycles she is likely not currently in menopause.  Discussed that people will experience vasomotor symptoms of menopause for several years before and several years after menopause.  We will check FSH and estradiol levels today.  Provided with handout on menopause. Pap smear collected for cervical cancer screening Enlarged uterus we will obtain pelvic ultrasound to evaluate for fibroids. Discussed routine screening: Mammogram is ordered.  Patient does not desire to complete a colonoscopy but is open to Cologuard.  Cologuard test ordered.  We will plan  to follow-up in 4 weeks after her pelvic ultrasound.  More than 30 minutes were spent face to face with the patient in the room, reviewing the medical record, labs and images, and coordinating care for the patient. The plan of management was discussed in detail and counseling was provided.       Adrian Prows MD [facility], [facility] 08/02/2021 3:09 PM

## 2021-08-08 LAB — IGP,CTNGTV,APT HPV,RFX16/18,45
Chlamydia, Nuc. Acid Amp: NEGATIVE
Gonococcus, Nuc. Acid Amp: NEGATIVE
HPV Aptima: NEGATIVE
Trich vag by NAA: NEGATIVE

## 2021-08-16 LAB — COLOGUARD: Cologuard: NEGATIVE

## 2021-08-22 ENCOUNTER — Ambulatory Visit: Payer: No Typology Code available for payment source

## 2021-08-23 ENCOUNTER — Other Ambulatory Visit: Payer: Self-pay

## 2021-08-23 ENCOUNTER — Ambulatory Visit
Admission: RE | Admit: 2021-08-23 | Discharge: 2021-08-23 | Disposition: A | Discharge status: Home / Self Care | Payer: No Typology Code available for payment source | Source: Ambulatory Visit | Attending: Nurse Practitioner | Admitting: Nurse Practitioner

## 2021-08-23 DIAGNOSIS — Z1231 Encounter for screening mammogram for malignant neoplasm of breast: Secondary | ICD-10-CM | POA: Diagnosis present

## 2021-08-24 LAB — FOLLICLE STIMULATING HORMONE

## 2021-08-24 LAB — ESTRADIOL

## 2021-09-03 ENCOUNTER — Other Ambulatory Visit: Payer: Self-pay

## 2021-09-03 ENCOUNTER — Ambulatory Visit: Payer: No Typology Code available for payment source | Admitting: Dermatology

## 2021-09-03 DIAGNOSIS — L811 Chloasma: Secondary | ICD-10-CM

## 2021-09-03 DIAGNOSIS — R61 Generalized hyperhidrosis: Secondary | ICD-10-CM | POA: Diagnosis not present

## 2021-09-03 NOTE — Patient Instructions (Addendum)
If you have any questions or concerns for your doctor, please call our main line at [phone] and press option 4 to reach your doctor's medical assistant. If no one answers, please leave a voicemail as directed and we will return your call as soon as possible. Messages left after 4 pm will be answered the following business day.   You may also send [place] a message via [place]. We typically respond to MyChart messages within 1-2 business days.  For prescription refills, please ask your pharmacy to contact our office. Our fax number is [phone].  If you have an urgent issue when the clinic is closed that cannot wait until the next business day, you can page your doctor at the number below.    Please note that while we do our best to be available for urgent issues outside of office hours, we are not available 24/7.   If you have an urgent issue and are unable to reach [place], you may choose to seek medical care at your doctor's office, retail clinic, urgent care center, or emergency room.  If you have a medical emergency, please immediately call 911 or go to the emergency department.  Pager Numbers  - Dr. Nehemiah Massed: [phone]  - Dr. Laurence Ferrari: [phone]  - Dr. Nicole Kindred: [phone]  In the event of inclement weather, please call our main line at [phone] for an update on the status of any delays or closures.  Dermatology Medication Tips: Please keep the boxes that topical medications come in in order to help keep track of the instructions about where and how to use these. Pharmacies typically print the medication instructions only on the boxes and not directly on the medication tubes.   If your medication is too expensive, please contact our office at [phone] option 4 or send [place] a message through [place].   We are unable to tell what your co-pay for medications will be in advance as this is different depending on your insurance coverage. However, we may be able to find a substitute  medication at lower cost or fill out paperwork to get insurance to cover a needed medication.   If a prior authorization is required to get your medication covered by your insurance company, please allow [place] 1-2 business days to complete this process.  Drug prices often vary depending on where the prescription is filled and some pharmacies may offer cheaper prices.  The website www.goodrx.com contains coupons for medications through different pharmacies. The prices here do not account for what the cost may be with help from insurance (it may be cheaper with your insurance), but the website can give you the price if you did not use any insurance.  - You can print the associated coupon and take it with your prescription to the pharmacy.  - You may also stop by our office during regular business hours and pick up a GoodRx coupon card.  - If you need your prescription sent electronically to a different pharmacy, notify our office through [facility] or by phone at [phone] option 4.   In the winter start Inkey Tranexamic Acid Hyperpigmentation Treatment from [place] for the melasma on the forehead

## 2021-09-03 NOTE — Progress Notes (Signed)
   Follow-Up Visit   Subjective  Kaitlyn Massey is a 45 y.o. female who presents for the following: melasma (Face, Hydroquinone cr 4% qd) and Excessive Sweating (Bil axilla, not using drysol, using secret sweat and odor). Which helps.  Overall she feel that she is improved with using Hydroquinone and Elta MD suncreen since February.   The following portions of the chart were reviewed this encounter and updated as appropriate:       Review of Systems:  No other skin or systemic complaints except as noted in HPI or Assessment and Plan.  Objective  Well appearing patient in no apparent distress; mood and affect are within normal limits.  A focused examination was performed including face, bil axilla. Relevant physical exam findings are noted in the Assessment and Plan.  bil axilla clear  face Lightly pigmented reticulated patches forehead, improved when compared to baseline photos      Assessment & Plan  Hyperhidrosis bil axilla  Controlled  Cont OTC Secret Sweat and Odor  Melasma face  Improving Melasma is a chronic condition of persistent pigmented patches generally on the face, worse in summer due to higher UV exposure.  Oral estrogen containing BCPs or supplements can exacerbate condition.  Recommend daily broad spectrum tinted sunscreen SPF 30+ to face, preferably with Zinc or Titanium Dioxide. Discussed Rx topical bleaching creams (i.e. hydroquinone), OTC HelioCare supplement, chemical peels (would need multiple for best result).   Cont Elta MD SPF qd D/c Hydroquinine 4% during winter Recommend starting Inkey Tranexamic Acid Hyperpigmentation Treatment qhs during the winter    Related Medications hydroquinone 4 % cream Apply to dark spots on forehead twice daily until improved.  Return in about 6 months (around 03/03/2022).  I, Othelia Pulling, RMA, am acting as scribe for Brendolyn Patty, MD .  Documentation: I have reviewed the above documentation for accuracy  and completeness, and I agree with the above.  Brendolyn Patty MD

## 2021-09-10 ENCOUNTER — Ambulatory Visit: Payer: No Typology Code available for payment source | Admitting: Dermatology

## 2021-09-27 ENCOUNTER — Ambulatory Visit
Admission: RE | Admit: 2021-09-27 | Discharge: 2021-09-27 | Disposition: A | Discharge status: Home / Self Care | Payer: No Typology Code available for payment source | Source: Ambulatory Visit | Attending: Obstetrics and Gynecology | Admitting: Obstetrics and Gynecology

## 2021-09-27 ENCOUNTER — Other Ambulatory Visit: Payer: Self-pay

## 2021-09-27 DIAGNOSIS — N852 Hypertrophy of uterus: Secondary | ICD-10-CM | POA: Insufficient documentation

## 2021-10-01 ENCOUNTER — Encounter: Payer: Self-pay | Admitting: Obstetrics and Gynecology

## 2021-10-01 ENCOUNTER — Other Ambulatory Visit: Payer: Self-pay

## 2021-10-01 ENCOUNTER — Ambulatory Visit (INDEPENDENT_AMBULATORY_CARE_PROVIDER_SITE_OTHER): Payer: No Typology Code available for payment source | Admitting: Obstetrics and Gynecology

## 2021-10-01 VITALS — BP 120/70 | Ht 64.0 in | Wt 277.6 lb

## 2021-10-01 DIAGNOSIS — N951 Menopausal and female climacteric states: Secondary | ICD-10-CM | POA: Diagnosis not present

## 2021-10-01 NOTE — Progress Notes (Signed)
Patient ID: Kaitlyn Massey, female   DOB: Mar 15, 1976, 45 y.o.   MRN: [id number]  Reason for Consult: Results   Referred by Danelle Berry, NP  Subjective:     HPI:  Kaitlyn Massey is a 45 y.o. female. She is here to follow up her pelvic US results. She does not have issues with heavy bleeding. She uses a xulane patch for menstrual cycle control.   Gynecological History  No LMP recorded. (Menstrual status: Irregular Periods).  Past Medical History:  Diagnosis Date   Arthritis    Depression    GERD (gastroesophageal reflux disease)    Family History  Problem Relation Age of Onset   Breast cancer Maternal Aunt    Colon cancer Neg Hx    Bladder Cancer Neg Hx    Kidney cancer Neg Hx    Past Surgical History:  Procedure Laterality Date   None      Short Social History:  Social History   Tobacco Use   Smoking status: Never   Smokeless tobacco: Never  Substance Use Topics   Alcohol use: No    Allergies  Allergen Reactions   No Known Allergies     Current Outpatient Medications  Medication Sig Dispense Refill   aluminum chloride (DRYSOL) 20 % external solution Apply to underarms 3 nights a week, increasing to nightly as tolerated. 60 mL 3   Azelastine-Fluticasone 137-50 MCG/ACT SUSP Dymista 137 mcg-50 mcg/spray nasal spray     chlorthalidone (HYGROTON) 25 MG tablet TAKE 1 TABLET BY MOUTH DAILY IN THE MORNING     Chromium 200 MCG CAPS Take by mouth.     EUFLEXXA 20 MG/2ML SOSY      fenofibrate (TRICOR) 145 MG tablet      hydroquinone 4 % cream Apply to dark spots on forehead twice daily until improved. 28.35 g 1   levothyroxine (SYNTHROID) 50 MCG tablet Take 50 mcg by mouth daily before breakfast.     norelgestromin-ethinyl estradiol Marilu Favre) 150-35 MCG/24HR transdermal patch Xulane 150 mcg-35 mcg/24 hr transdermal patch     Omega-3 350 MG CPDR Take by mouth.     omeprazole (PRILOSEC) 20 MG capsule Take 1 capsule (20 mg total) by mouth daily. Take 30  minutes prior to breakfast 30 capsule 3   pimecrolimus (ELIDEL) 1 % cream Apply topically 2 (two) times daily. Apply to underarms as needed for itchy rash 30 g 3   potassium chloride (KLOR-CON) 10 MEQ tablet Take 10 mEq by mouth every morning.     tacrolimus (PROTOPIC) 0.1 % ointment Apply topically 2 (two) times daily. Apply to aa's rash on axilla QD-BID PRN. 60 g 3   venlafaxine XR (EFFEXOR-XR) 37.5 MG 24 hr capsule      vitamin C (ASCORBIC ACID) 500 MG tablet Take 500 mg by mouth daily.     methimazole (TAPAZOLE) 5 MG tablet Take 1 tablet by mouth daily.     No current facility-administered medications for this visit.    Review of Systems  Constitutional: Negative for chills, fatigue, fever and unexpected weight change.  HENT: Negative for trouble swallowing.  Eyes: Negative for loss of vision.  Respiratory: Negative for cough, shortness of breath and wheezing.  Cardiovascular: Negative for chest pain, leg swelling, palpitations and syncope.  GI: Negative for abdominal pain, blood in stool, diarrhea, nausea and vomiting.  GU: Negative for difficulty urinating, dysuria, frequency and hematuria.  Musculoskeletal: Negative for back pain, leg pain and joint pain.  Skin: Negative for  rash.  Neurological: Negative for dizziness, headaches, light-headedness, numbness and seizures.  Psychiatric: Negative for behavioral problem, confusion, depressed mood and sleep disturbance.       Objective:  Objective   Vitals:   10/01/21 1629  BP: 120/70  Weight: 277 lb 9.6 oz (125.9 kg)  Height: 5' 4"  (1.626 m)   Body mass index is 47.65 kg/m.  Physical Exam Vitals and nursing note reviewed. Exam conducted with a chaperone present.  Constitutional:      Appearance: Normal appearance.  HENT:     Head: Normocephalic and atraumatic.  Eyes:     Extraocular Movements: Extraocular movements intact.     Pupils: Pupils are equal, round, and reactive to light.  Cardiovascular:     Rate and  Rhythm: Normal rate and regular rhythm.  Pulmonary:     Effort: Pulmonary effort is normal.     Breath sounds: Normal breath sounds.  Abdominal:     General: Abdomen is flat.     Palpations: Abdomen is soft.  Musculoskeletal:     Cervical back: Normal range of motion.  Skin:    General: Skin is warm and dry.  Neurological:     General: No focal deficit present.     Mental Status: She is alert and oriented to person, place, and time.  Psychiatric:        Behavior: Behavior normal.        Thought Content: Thought content normal.        Judgment: Judgment normal.    Assessment/Plan:     45 yo with enlarged fibroid uterus Reviewed the results which are similar to her pror [place] in 2020.  Reviewed [place] for xulane whic his a 2 based on her age. Denies other risk factors at this time [facility] and estradiol have not resulted. Will collect again today.   More than 15 minutes were spent face to face with the patient in the room, reviewing the medical record, labs and images, and coordinating care for the patient. The plan of management was discussed in detail and counseling was provided.    Adrian Prows MD [facility] 10/01/2021 5:09 PM

## 2021-10-03 LAB — FOLLICLE STIMULATING HORMONE: FSH: 1.9 m[IU]/mL

## 2021-10-03 LAB — ESTRADIOL: Estradiol: 8.2 pg/mL

## 2022-01-05 IMAGING — MG MM DIGITAL SCREENING BILAT W/ TOMO AND CAD
8 series · 8 of 24 positions shown · non-contrast
Comparison: Previous exam(s).

CLINICAL DATA: Screening.

EXAM:
DIGITAL SCREENING BILATERAL MAMMOGRAM WITH TOMOSYNTHESIS AND CAD
TECHNIQUE: Bilateral screening digital craniocaudal and mediolateral oblique
mammograms were obtained. Bilateral screening digital breast
tomosynthesis was performed. The images were evaluated with
computer-aided detection.

[R CC synth-2D]
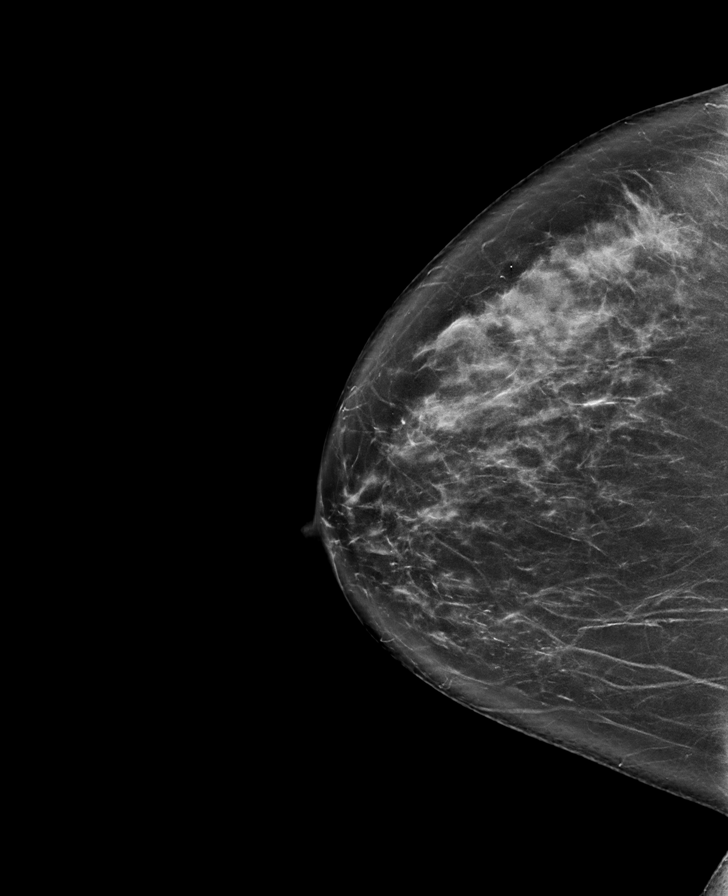

[L CC synth-2D]
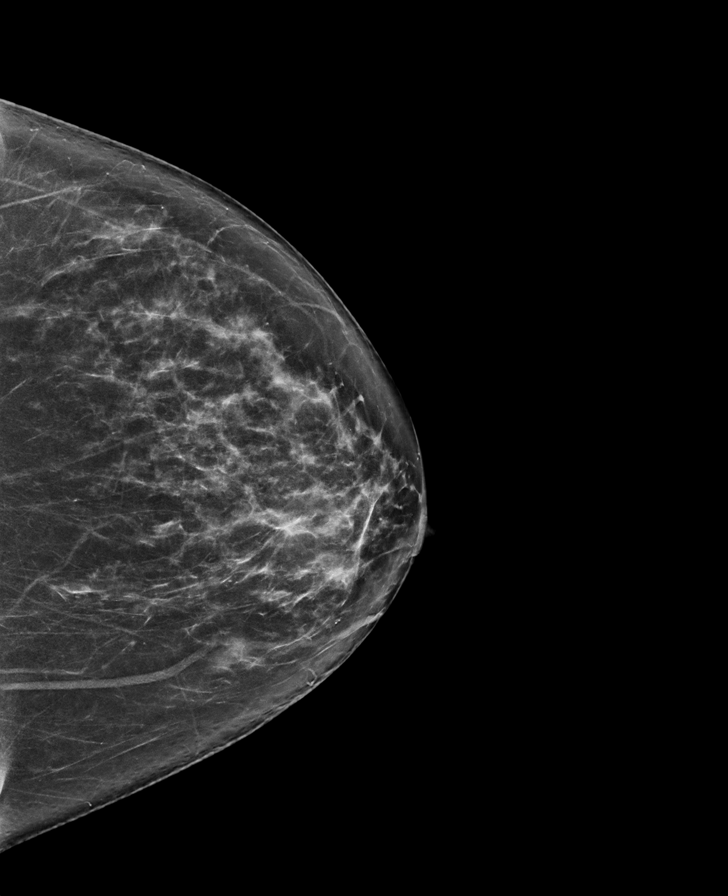

[L MLO synth-2D]
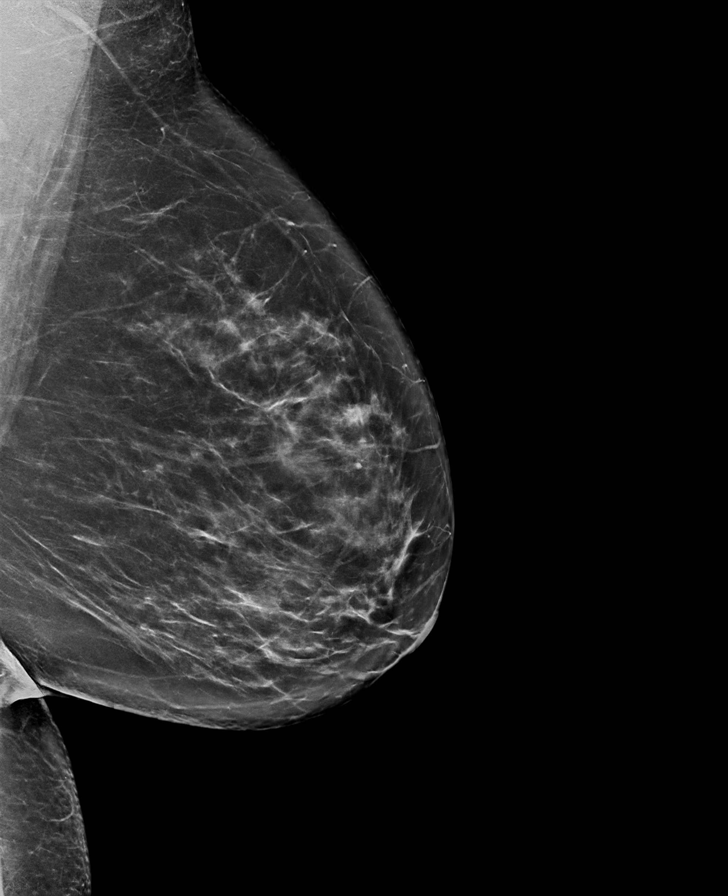

[R MLO synth-2D]
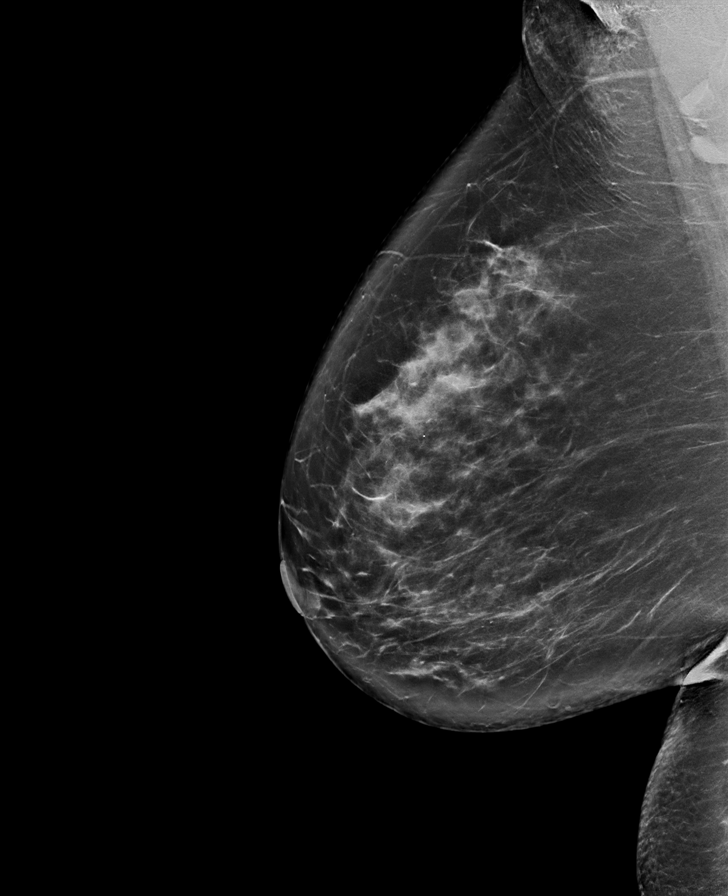

[R MLO tomo · tomo slice 48/95.0]
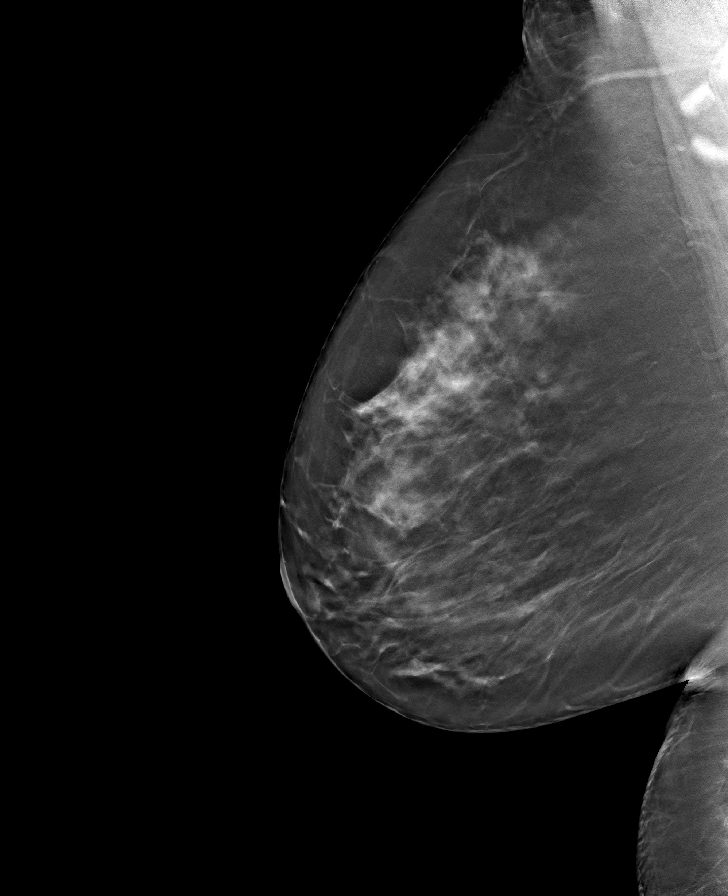

[L MLO tomo · tomo slice 45/89.0]
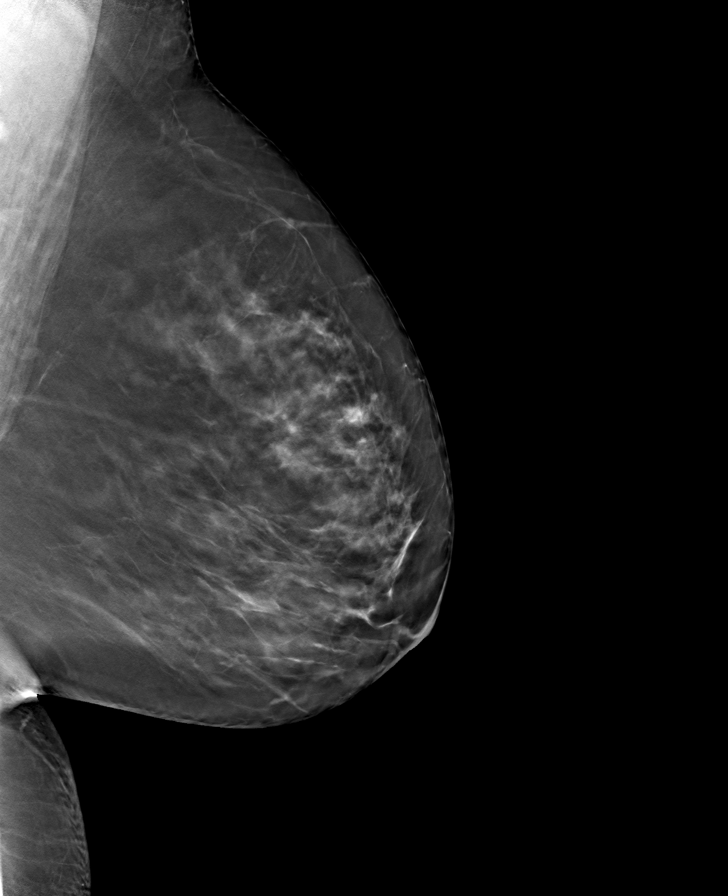

[R CC tomo · tomo slice 41/82.0]
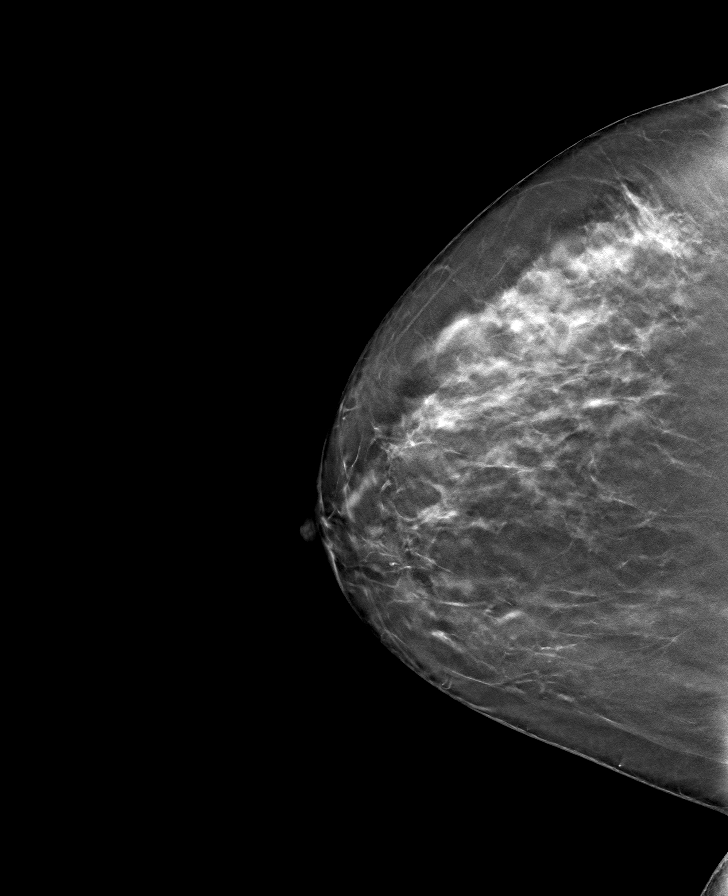

[L CC tomo · tomo slice 41/80.0]
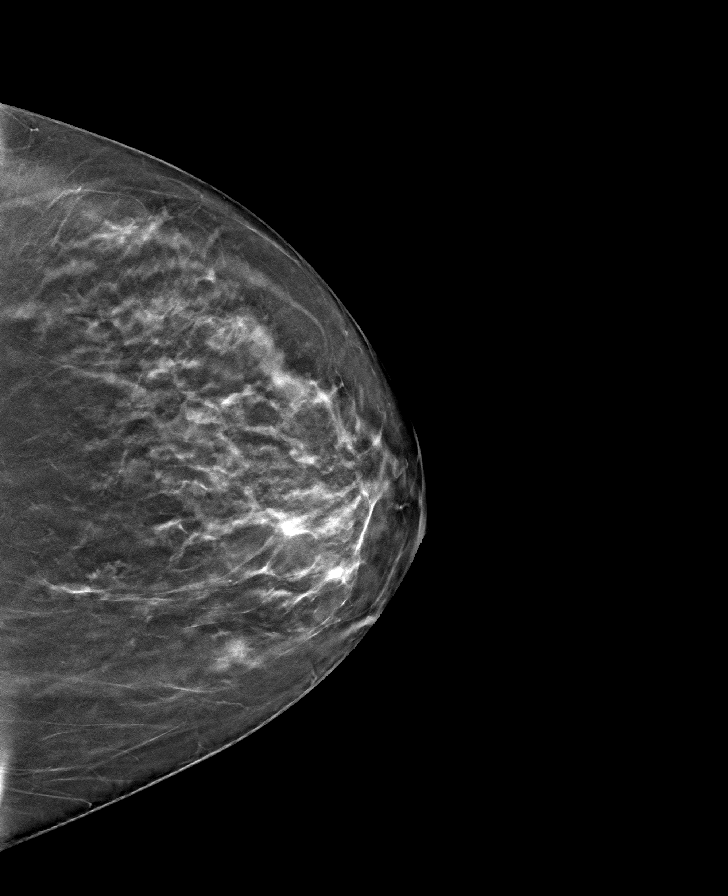

[8 of 24 positions shown; findings below may reference images not displayed]

ACR Breast Density Category c: The breast tissue is heterogeneously
dense, which may obscure small masses.
FINDINGS: There are no findings suspicious for malignancy.
IMPRESSION: No mammographic evidence of malignancy. A result letter of this
screening mammogram will be mailed directly to the patient.

RECOMMENDATION:
Screening mammogram in one year. (Code:Q3-W-BC3)

BI-RADS CATEGORY  1: Negative.

## 2022-02-09 IMAGING — US US PELVIS COMPLETE WITH TRANSVAGINAL
1 series · 13 of 25 positions shown · non-contrast
Comparison: 08/02/2019

CLINICAL DATA: Enlarged uterus



[Series 1: us pelvic complete with transvaginal · 13 of 127 slices shown]
[im 1/127]
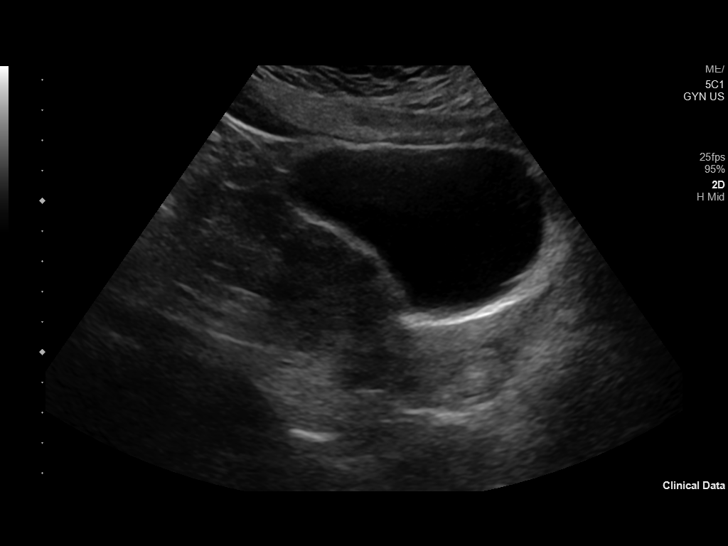
[im 11/127]
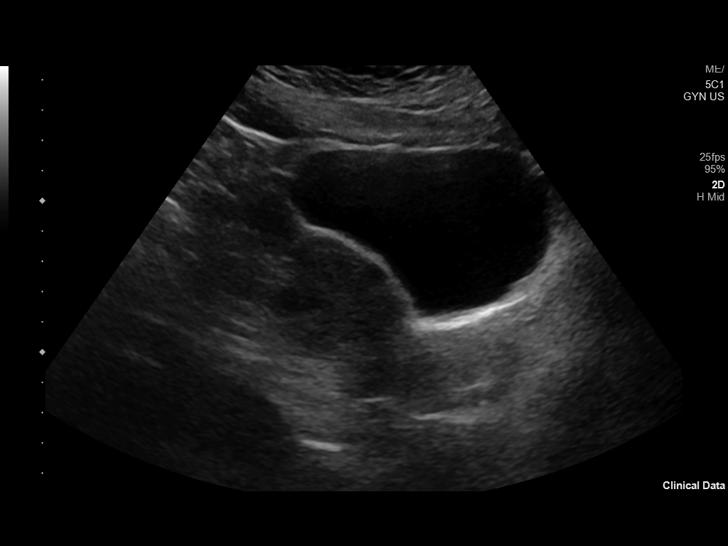
[im 22/127]
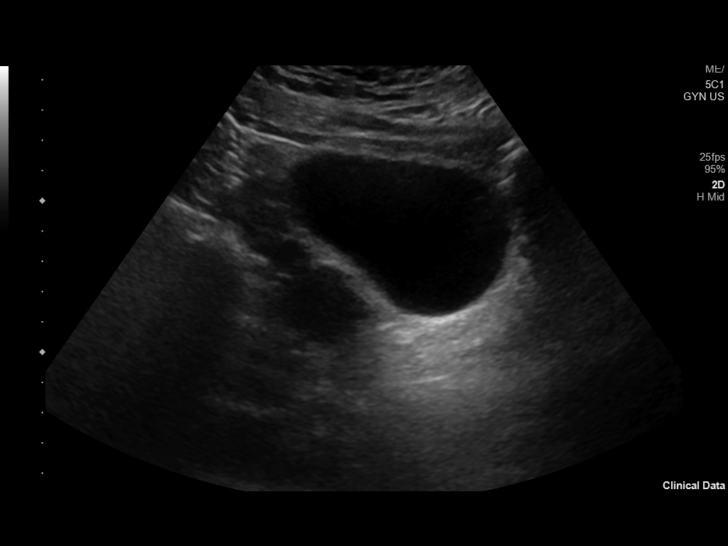
[im 32/127]
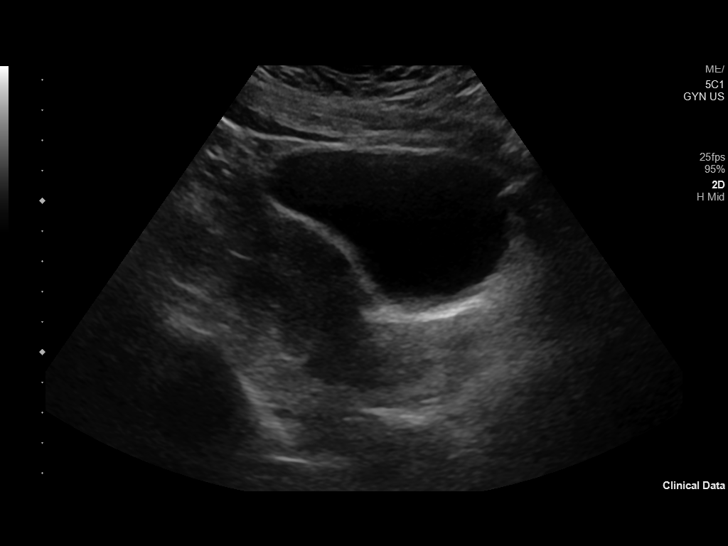
[im 43/127]
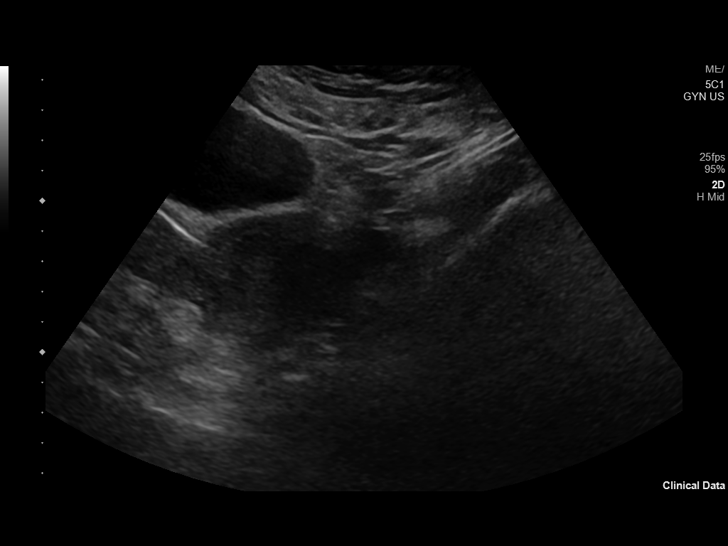
[im 53/127]
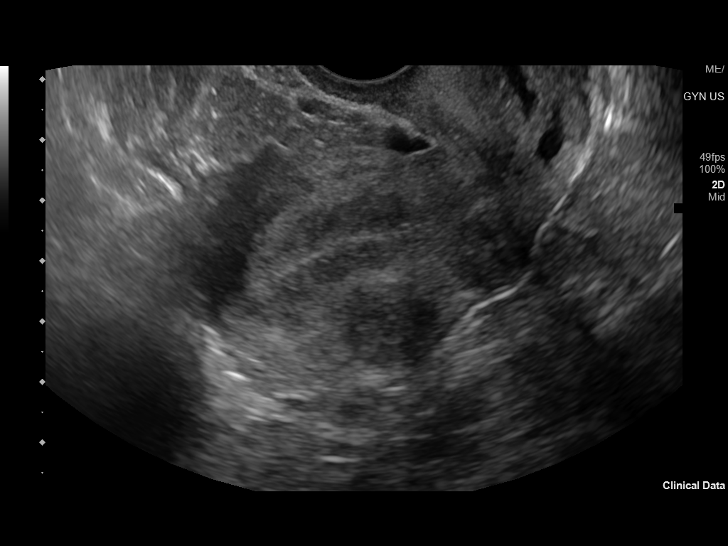
[im 64/127]
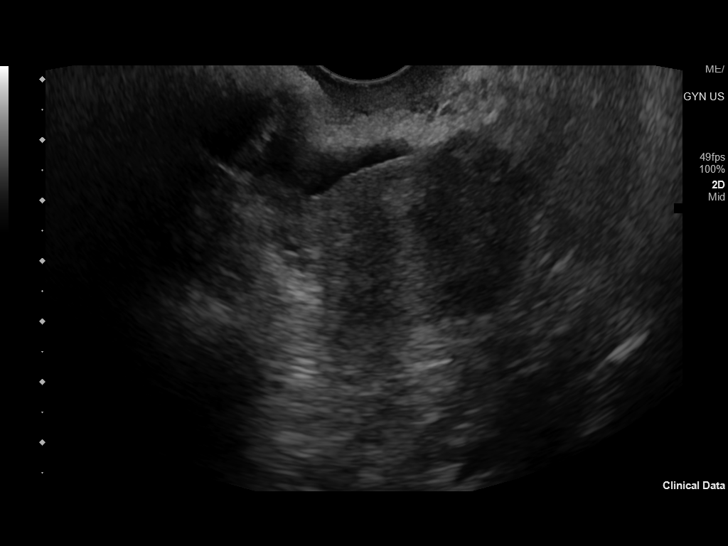
[im 74/127]
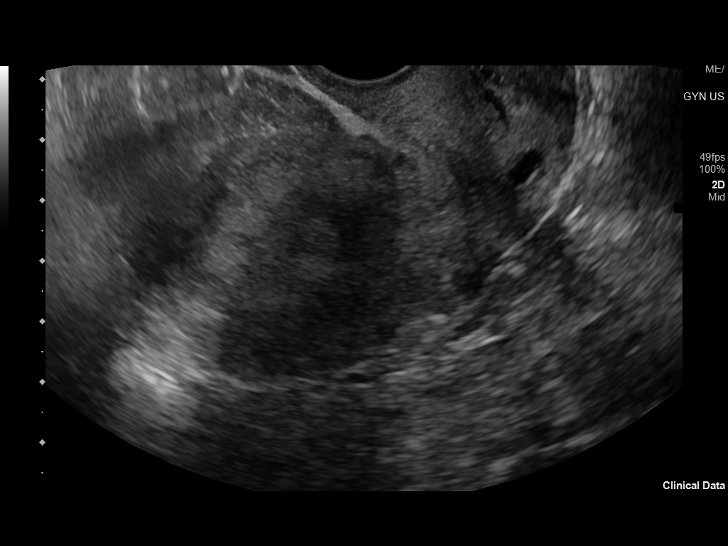
[im 85/127]
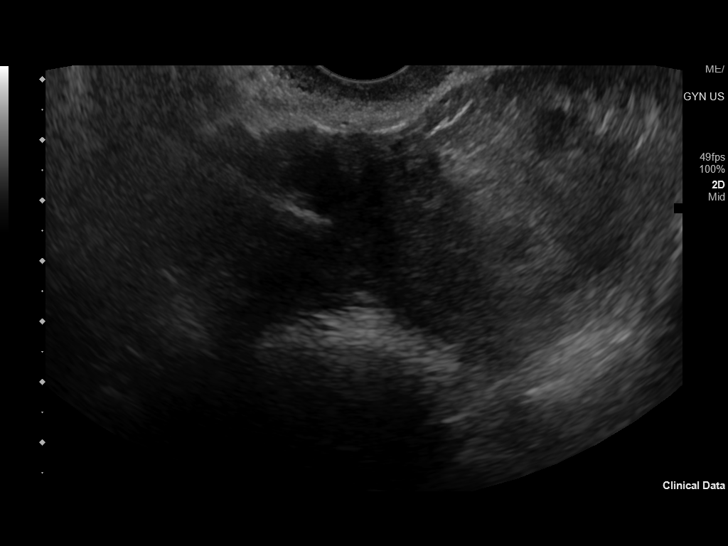
[im 95/127]
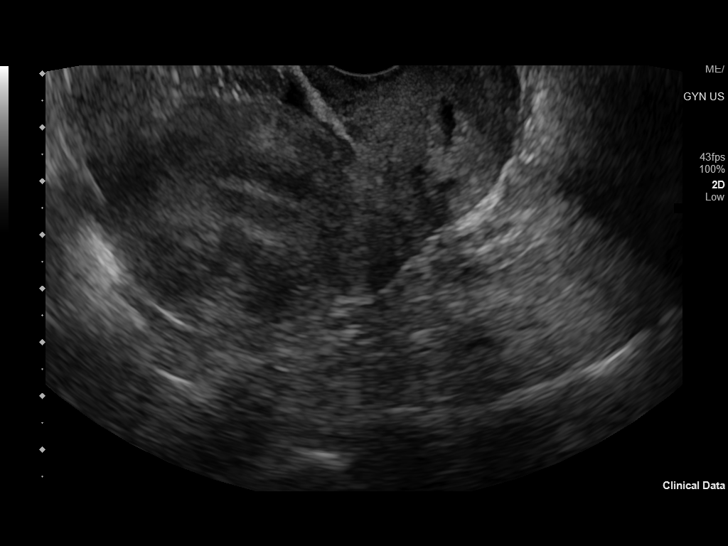
[im 106/127]
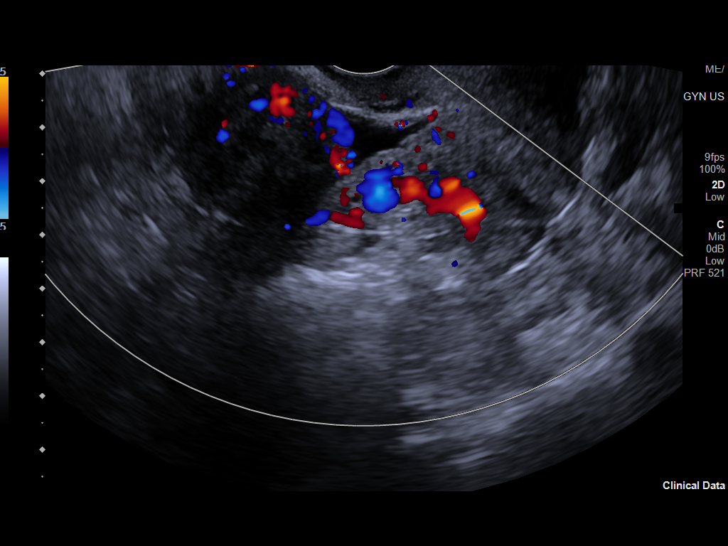
[im 116/127]
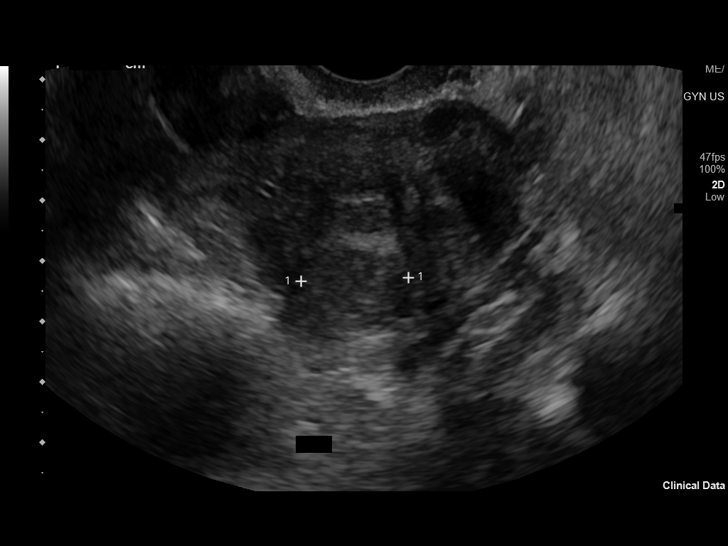
[im 127/127]
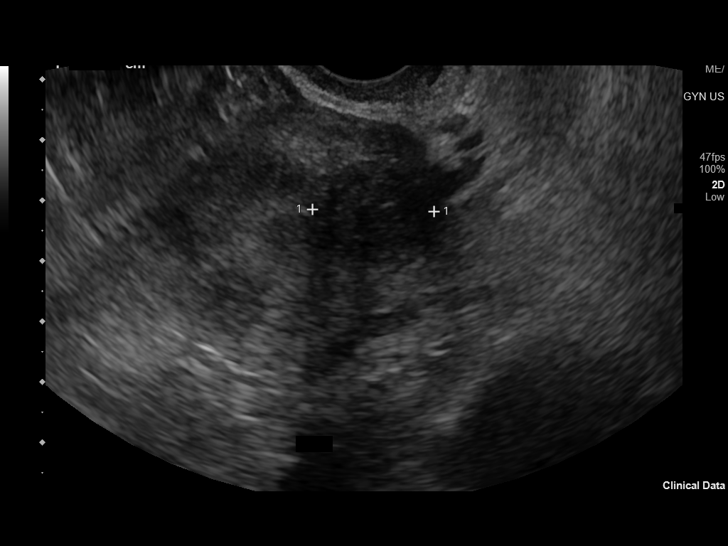

[13 of 25 positions shown; findings below may reference images not displayed]

FINDINGS: Uterus

Measurements: 6.4 x 4.5 x 5.0 cm = volume: 74 mL.

Multiple uterine fibroids are seen, predominately within the uterine
body. The largest fibroid is subserosal, located along the right
uterine fundus, measuring 3.5 x 3.3 x 3.0 cm.

Fibroid 2 is located in the myometrium measuring 1.8 x 1.5 x 1.5 cm.

Fibroid 3 is located within the uterine body myometrium measuring
1.1 x 1.1 x 1.3 cm.

Fibroid 4 is located within the uterine body myometrium measuring
2.1 x 2.0 x 1.9 cm.

Endometrium

Thickness: 12 mm.  No focal abnormality visualized.

Right ovary

Measurements: 3.2 x 1.9 x 2.1 cm = volume: 6.8 mL. Normal
appearance/no adnexal mass.

Left ovary

Not visualized.

Other findings

No abnormal free fluid.
IMPRESSION: Fibroid uterus.

## 2022-03-03 ENCOUNTER — Other Ambulatory Visit: Payer: Self-pay

## 2022-03-03 ENCOUNTER — Ambulatory Visit: Payer: No Typology Code available for payment source | Admitting: Dermatology

## 2022-03-03 DIAGNOSIS — L811 Chloasma: Secondary | ICD-10-CM | POA: Diagnosis not present

## 2022-03-03 NOTE — Progress Notes (Signed)
? ?  Follow-Up Visit ?  ?Subjective  ?Kaitlyn Massey is a 46 y.o. female who presents for the following: Follow-up. ? ?Patient here for 6 month follow-up Melasma of the forehead. She is using INKEY Tranexamic Acid Treatment during the winter, and previously had used hydroquinone 4% cream. Areas are improving some.  ? ?The following portions of the chart were reviewed this encounter and updated as appropriate:  ?  ?  ? ?Review of Systems:  No other skin or systemic complaints except as noted in HPI or Assessment and Plan. ? ?Objective  ?Well appearing patient in no apparent distress; mood and affect are within normal limits. ? ?A focused examination was performed including face. Relevant physical exam findings are noted in the Assessment and Plan. ? ?forehead ?Light hyperpigmented patches - Same as previous photos at last visit 6 mo ago, but improved from baseline photo ? ? ? ?Assessment & Plan  ?Melasma ?forehead ? ?Chronic and persistent condition with duration or expected duration over one year. Condition is bothersome to patient. Not currently at goal.  ? ?Melasma is a chronic condition of persistent pigmented patches generally on the face, worse in summer due to higher UV exposure.  Oral estrogen containing BCPs or supplements can exacerbate condition.  Recommend daily broad spectrum tinted sunscreen SPF 30+ to face, preferably with Zinc or Titanium Dioxide. Discussed Rx topical bleaching creams (i.e. hydroquinone), OTC HelioCare supplement, chemical peels (would need multiple for best result).  ? ?Start  Hydroquinone: 4.5%, Tretinoin: 0.025%, Fluocinolone: 0.01%, Niacinamide: 4% Cream Apply to dark patches on forehead every night, no longer than 3 months at a time.  ? ?Samples of Elta MD Clear Tinted given to pt. Continue daily mineral based sunscreen spf 30 or higher. ?Recommend Heliocare supplements 2 tabs PO qam and mid day when outdoors ? ?Consider changing birth control to one that doesn't contain  estrogen. ? ?Related Medications ?hydroquinone 4 % cream ?Apply to dark spots on forehead twice daily until improved. ? ? ?Return in about 3 months (around 06/03/2022) for melasma. ? ?I, Jamesetta Orleans, CMA, am acting as scribe for Brendolyn Patty, MD . ?Documentation: I have reviewed the above documentation for accuracy and completeness, and I agree with the above. ? ?Brendolyn Patty MD  ? ? ?

## 2022-03-03 NOTE — Patient Instructions (Addendum)
Melasma is a chronic condition of persistent pigmented patches generally on the face, worse in summer due to higher UV exposure.  Oral estrogen containing BCPs or supplements can exacerbate condition.  Recommend daily broad spectrum tinted sunscreen SPF 30+ to face, preferably with Zinc or Titanium Dioxide. Discussed Rx topical bleaching creams (i.e. hydroquinone), OTC HelioCare supplement (twice a day), chemical peels (would need multiple for best result).  ? ?Instructions for [facility] Medications ? ?One or more of your medications was sent to the [facility] mail order compounding pharmacy. You will receive an email from them and can purchase the medicine through that link. It will then be mailed to your home at the address you confirmed. If for any reason you do not receive an email from them, please check your spam folder. If you still do not find the email, please let us know. [facility] phone number is [phone].  ? ?If You Need Anything After Your Visit ? ?If you have any questions or concerns for your doctor, please call our main line at [phone] and press option 4 to reach your doctor's medical assistant. If no one answers, please leave a voicemail as directed and we will return your call as soon as possible. Messages left after 4 pm will be answered the following business day.  ? ?You may also send [place] a message via MyChart. We typically respond to MyChart messages within 1-2 business days. ? ?For prescription refills, please ask your pharmacy to contact our office. Our fax number is [phone]. ? ?If you have an urgent issue when the clinic is closed that cannot wait until the next business day, you can page your doctor at the number below.   ? ?Please note that while we do our best to be available for urgent issues outside of office hours, we are not available 24/7.  ? ?If you have an urgent issue and are unable to reach [place], you may choose to seek medical care at your doctor's  office, retail clinic, urgent care center, or emergency room. ? ?If you have a medical emergency, please immediately call 911 or go to the emergency department. ? ?Pager Numbers ? ?- Dr. Nehemiah Massed: [phone] ? ?- Dr. Laurence Ferrari: [phone] ? ?- Dr. Nicole Kindred: [phone] ? ?In the event of inclement weather, please call our main line at [phone] for an update on the status of any delays or closures. ? ?Dermatology Medication Tips: ?Please keep the boxes that topical medications come in in order to help keep track of the instructions about where and how to use these. Pharmacies typically print the medication instructions only on the boxes and not directly on the medication tubes.  ? ?If your medication is too expensive, please contact our office at [phone] option 4 or send [place] a message through [place].  ? ?We are unable to tell what your co-pay for medications will be in advance as this is different depending on your insurance coverage. However, we may be able to find a substitute medication at lower cost or fill out paperwork to get insurance to cover a needed medication.  ? ?If a prior authorization is required to get your medication covered by your insurance company, please allow [place] 1-2 business days to complete this process. ? ?Drug prices often vary depending on where the prescription is filled and some pharmacies may offer cheaper prices. ? ?The website www.goodrx.com contains coupons for medications through different pharmacies. The prices here do not account for what the cost may be  with help from insurance (it may be cheaper with your insurance), but the website can give you the price if you did not use any insurance.  ?- You can print the associated coupon and take it with your prescription to the pharmacy.  ?- You may also stop by our office during regular business hours and pick up a GoodRx coupon card.  ?- If you need your prescription sent electronically to a different pharmacy, notify our office  through [facility] or by phone at [phone] option 4. ? ? ? ? ?Si Usted Necesita Algo Despu?s de Su Visita ? ?Tambi?n puede enviarnos un mensaje a trav?s de MyChart. Por lo general respondemos a los mensajes de MyChart en el transcurso de 1 a 2 d?as h?biles. ? ?Para renovar recetas, por favor pida a su farmacia que se ponga en contacto con nuestra oficina. Nuestro n?mero de fax es el [phone]. ? ?Si tiene un asunto urgente cuando la cl?nica est? cerrada y que no puede esperar hasta el siguiente d?a h?bil, puede llamar/localizar a su doctor(a) al n?mero que aparece a continuaci?n.  ? ?Por favor, tenga en cuenta que aunque hacemos todo lo posible para estar disponibles para asuntos urgentes fuera del horario de oficina, no estamos disponibles las 24 horas del d?a, los 7 d?as de la semana.  ? ?Si tiene un problema urgente y no puede comunicarse con nosotros, puede optar por buscar atenci?n m?dica  en el consultorio de su doctor(a), en una cl?nica privada, en un centro de atenci?n urgente o en una sala de emergencias. ? ?Si tiene Engineer, maintenance (IT) m?dica, por favor llame inmediatamente al 911 o vaya a la sala de emergencias. ? ?N?meros de b?per ? ?- Dr. Nehemiah Massed: [phone] ? ?- Dra. Moye: [phone] ? ?- Dra. Nicole Kindred: [phone] ? ?En caso de inclemencias del tiempo, por favor llame a nuestra l?nea principal al [phone] para una actualizaci?n sobre el estado de cualquier retraso o cierre. ? ?Consejos para la medicaci?n en dermatolog?a: ?Por favor, guarde las cajas en las que vienen los medicamentos de uso t?pico para ayudarle a seguir las instrucciones sobre d?nde y c?mo usarlos. Las farmacias generalmente imprimen las instrucciones del medicamento s?lo en las cajas y no directamente en los tubos del [place].  ? ?Si su medicamento es muy caro, por favor, p?ngase en contacto con Zigmund Daniel llamando al [phone] y presione la opci?n 4 o env?enos un mensaje a trav?s de MyChart.  ? ?No  podemos decirle cu?l ser? su copago por los medicamentos por adelantado ya que esto es diferente dependiendo de la cobertura de su seguro. Sin embargo, es posible que podamos encontrar un medicamento sustituto a Electrical engineer un formulario para que el seguro cubra el medicamento que se considera necesario.  ? ?Si se requiere Ardelia Mems autorizaci?n previa para que su compa??a de seguros [place] su medicamento, por favor perm?tanos de 1 a 2 d?as h?biles para completar este proceso. ? ?Los precios de los medicamentos var?an con frecuencia dependiendo del Environmental consultant de d?nde se surte la receta y alguna farmacias pueden ofrecer precios m?s baratos. ? ?El sitio web www.goodrx.com tiene cupones para medicamentos de Airline pilot. Los precios aqu? no tienen en cuenta lo que podr?a costar con la ayuda del seguro (puede ser m?s barato con su seguro), pero el sitio web puede darle el precio si no utiliz? ning?n seguro.  ?- Puede imprimir el cup?n correspondiente y llevarlo con su receta a la farmacia.  ?- Tambi?n puede pasar por nuestra oficina durante  el horario de atenci?n regular y Charity fundraiser una tarjeta de cupones de GoodRx.  ?- Si necesita que su receta se env?e electr?nicamente a Chiropodist, informe a nuestra oficina a trav?s de MyChart de [place] o por tel?fono llamando al [phone] y presione la opci?n 4. ? ?

## 2022-04-13 ENCOUNTER — Encounter: Payer: Self-pay | Admitting: Dermatology

## 2022-04-13 ENCOUNTER — Encounter: Payer: Self-pay | Admitting: Obstetrics and Gynecology

## 2022-04-14 NOTE — Telephone Encounter (Signed)
Should she try lupron?

## 2022-04-18 NOTE — Telephone Encounter (Signed)
Please advise 

## 2022-04-29 ENCOUNTER — Encounter: Payer: Self-pay | Admitting: Family Medicine

## 2022-04-29 ENCOUNTER — Ambulatory Visit: Payer: No Typology Code available for payment source | Admitting: Family Medicine

## 2022-04-29 VITALS — BP 120/80 | Ht 64.0 in | Wt 278.0 lb

## 2022-04-29 DIAGNOSIS — Z3045 Encounter for surveillance of transdermal patch hormonal contraceptive device: Secondary | ICD-10-CM

## 2022-04-29 MED ORDER — XULANE 150-35 MCG/24HR TD PTWK: 1.0000 | MEDICATED_PATCH | TRANSDERMAL | 4 refills | Status: DC

## 2022-04-29 NOTE — Progress Notes (Signed)
? ?Patient ID: Kaitlyn Massey, female   DOB: Mar 04, 1976, 46 y.o.   MRN: [id number] ? ?Reason for Consult: Fibroids ?  ?Referred by Kaitlyn Berry, NP ? ?Subjective:  ?   ?HPI: ? ?Kaitlyn Massey is a 46 y.o. female. She is here to follow up her pelvic US results. She does not have issues with heavy bleeding. She uses a  hormonal method  for menstrual cycle control and reports light bleeding.  ? ?Gynecological History ? ?Patient's last menstrual period was 04/23/2022. ? ?Past Medical History:  ?Diagnosis Date  ? Arthritis   ? Depression   ? GERD (gastroesophageal reflux disease)   ? ?Family History  ?Problem Relation Age of Onset  ? Breast cancer Maternal Aunt   ? Colon cancer Neg Hx   ? Bladder Cancer Neg Hx   ? Kidney cancer Neg Hx   ? ?Past Surgical History:  ?Procedure Laterality Date  ? None    ? ? ?Short Social History:  ?Social History  ? ?Tobacco Use  ? Smoking status: Never  ? Smokeless tobacco: Never  ?Substance Use Topics  ? Alcohol use: No  ? ? ?Allergies  ?Allergen Reactions  ? No Known Allergies   ? ? ?Current Outpatient Medications  ?Medication Sig Dispense Refill  ? aluminum chloride (DRYSOL) 20 % external solution Apply to underarms 3 nights a week, increasing to nightly as tolerated. 60 mL 3  ? Azelastine-Fluticasone 137-50 MCG/ACT SUSP Dymista 137 mcg-50 mcg/spray nasal spray    ? Chromium 200 MCG CAPS Take by mouth.    ? EUFLEXXA 20 MG/2ML SOSY     ? fenofibrate (TRICOR) 145 MG tablet     ? hydroquinone 4 % cream Apply to dark spots on forehead twice daily until improved. 28.35 g 1  ? levothyroxine (SYNTHROID) 50 MCG tablet Take 50 mcg by mouth daily before breakfast.    ? Omega-3 350 MG CPDR Take by mouth.    ? omeprazole (PRILOSEC) 20 MG capsule Take 1 capsule (20 mg total) by mouth daily. Take 30 minutes prior to breakfast 30 capsule 3  ? venlafaxine XR (EFFEXOR-XR) 37.5 MG 24 hr capsule     ? vitamin C (ASCORBIC ACID) 500 MG tablet Take 500 mg by mouth daily.    ? methimazole  (TAPAZOLE) 5 MG tablet Take 1 tablet by mouth daily.    ? norelgestromin-ethinyl estradiol Marilu Favre) 150-35 MCG/24HR transdermal patch Place 1 patch onto the skin once a week. 9 patch 4  ? ?No current facility-administered medications for this visit.  ? ? ?Review of Systems  ?Constitutional: Negative for chills, fatigue, fever and unexpected weight change.  ?HENT: Negative for trouble swallowing.  ?Eyes: Negative for loss of vision.  ?Respiratory: Negative for cough, shortness of breath and wheezing.  ?Cardiovascular: Negative for chest pain, leg swelling, palpitations and syncope.  ?GI: Negative for abdominal pain, blood in stool, diarrhea, nausea and vomiting.  ?GU: Negative for difficulty urinating, dysuria, frequency and hematuria.  ?Musculoskeletal: Negative for back pain, leg pain and joint pain.  ?Skin: Negative for rash.  ?Neurological: Negative for dizziness, headaches, light-headedness, numbness and seizures.  ?Psychiatric: Negative for behavioral problem, confusion, depressed mood and sleep disturbance.   ? ?   ?Objective:  ?Objective  ? ?Vitals:  ? 04/29/22 1559  ?BP: 120/80  ?Weight: 278 lb (126.1 kg)  ?Height: 5' 4"  (1.626 m)  ? ?Body mass index is 47.72 kg/m?. ? ?Gen: well appearing, NAD ?HEENT: no scleral icterus ?CV: RR ?Lung: Normal  WOB ?Ext: warm well perfused ? ? ?Assessment/Plan:  ?  ?Fibroids: stable and non-enlarging. Discussed that we do not recommend removal at this time. Reviewed continuing Xulane until 48 and then to stop to see she has bleeding. Sent in rx for xulane patch. Reviewed yearly visit for wellness.  ? ?Return in about 1 year (around 04/30/2023) for Yearly wellness exam. ? ?No future appointments. ? ? ?04/29/2022 ?4:24 PM ? ? ? ?

## 2022-04-30 ENCOUNTER — Other Ambulatory Visit: Payer: Self-pay

## 2022-04-30 DIAGNOSIS — Z3045 Encounter for surveillance of transdermal patch hormonal contraceptive device: Secondary | ICD-10-CM

## 2022-04-30 MED ORDER — XULANE 150-35 MCG/24HR TD PTWK: 1.0000 | MEDICATED_PATCH | TRANSDERMAL | 4 refills | Status: DC

## 2022-06-09 ENCOUNTER — Ambulatory Visit: Payer: No Typology Code available for payment source | Admitting: Dermatology

## 2022-06-23 ENCOUNTER — Encounter: Payer: Self-pay | Admitting: Family Medicine

## 2022-08-13 ENCOUNTER — Other Ambulatory Visit: Payer: Self-pay | Admitting: Nurse Practitioner

## 2022-08-13 DIAGNOSIS — Z1231 Encounter for screening mammogram for malignant neoplasm of breast: Secondary | ICD-10-CM

## 2022-09-08 ENCOUNTER — Encounter: Payer: Self-pay | Admitting: Family Medicine

## 2022-09-09 ENCOUNTER — Ambulatory Visit
Admission: RE | Admit: 2022-09-09 | Discharge: 2022-09-09 | Disposition: A | Discharge status: Home / Self Care | Payer: No Typology Code available for payment source | Source: Ambulatory Visit | Attending: Nurse Practitioner | Admitting: Nurse Practitioner

## 2022-09-09 DIAGNOSIS — Z1231 Encounter for screening mammogram for malignant neoplasm of breast: Secondary | ICD-10-CM | POA: Diagnosis present

## 2023-01-29 ENCOUNTER — Other Ambulatory Visit: Payer: Self-pay | Admitting: Nurse Practitioner

## 2023-01-29 DIAGNOSIS — E0590 Thyrotoxicosis, unspecified without thyrotoxic crisis or storm: Secondary | ICD-10-CM

## 2023-02-02 ENCOUNTER — Encounter: Payer: Self-pay | Admitting: Nurse Practitioner

## 2023-02-02 ENCOUNTER — Other Ambulatory Visit: Payer: Self-pay | Admitting: Nurse Practitioner

## 2023-02-02 DIAGNOSIS — I10 Essential (primary) hypertension: Secondary | ICD-10-CM

## 2023-02-02 DIAGNOSIS — R7303 Prediabetes: Secondary | ICD-10-CM

## 2023-02-03 ENCOUNTER — Encounter: Payer: Self-pay | Admitting: Nurse Practitioner

## 2023-02-04 ENCOUNTER — Encounter: Payer: Self-pay | Admitting: Nurse Practitioner

## 2023-02-27 ENCOUNTER — Encounter: Payer: Self-pay | Admitting: Nurse Practitioner

## 2023-03-03 ENCOUNTER — Encounter: Payer: Self-pay | Admitting: Nurse Practitioner

## 2023-03-19 ENCOUNTER — Other Ambulatory Visit: Payer: Self-pay | Admitting: Nurse Practitioner

## 2023-03-19 ENCOUNTER — Encounter: Payer: Self-pay | Admitting: Nurse Practitioner

## 2023-03-20 MED ORDER — VENLAFAXINE HCL ER 37.5 MG PO CP24: 37.5000 mg | ORAL_CAPSULE | Freq: Every day | ORAL | 3 refills | Status: DC

## 2023-04-06 LAB — TSH

## 2023-04-06 LAB — HEMOGLOBIN A1C
Est. average glucose Bld gHb Est-mCnc: 108 mg/dL
Hgb A1c MFr Bld: 5.4 % (ref 4.8–5.6)

## 2023-04-06 LAB — LIPID PANEL

## 2023-04-06 LAB — CMP14+EGFR

## 2023-04-07 ENCOUNTER — Encounter: Payer: Self-pay | Admitting: Nurse Practitioner

## 2023-04-10 ENCOUNTER — Ambulatory Visit: Payer: 59 | Admitting: Nurse Practitioner

## 2023-04-14 ENCOUNTER — Encounter: Payer: Self-pay | Admitting: Nurse Practitioner

## 2023-08-03 ENCOUNTER — Other Ambulatory Visit: Payer: Self-pay | Admitting: Nurse Practitioner

## 2023-08-03 DIAGNOSIS — Z1231 Encounter for screening mammogram for malignant neoplasm of breast: Secondary | ICD-10-CM

## 2023-09-04 ENCOUNTER — Encounter: Payer: Self-pay | Admitting: Family

## 2023-09-04 ENCOUNTER — Ambulatory Visit: Payer: 59 | Admitting: Family

## 2023-09-04 VITALS — BP 121/74 | HR 90 | Ht 64.0 in | Wt 275.2 lb

## 2023-09-04 DIAGNOSIS — M545 Low back pain, unspecified: Secondary | ICD-10-CM | POA: Diagnosis not present

## 2023-09-04 LAB — POCT URINALYSIS DIPSTICK
Bilirubin, UA: NEGATIVE
Blood, UA: 10
Glucose, UA: NEGATIVE
Ketones, UA: 5
Leukocytes, UA: NEGATIVE
Nitrite, UA: NEGATIVE
Protein, UA: POSITIVE — AB
Spec Grav, UA: >=1.03 — AB (ref 1.010–1.025)
Urobilinogen, UA: 0.2 U/dL
pH, UA: 6 (ref 5.0–8.0)

## 2023-09-04 MED ORDER — OMEPRAZOLE 40 MG PO CPDR: 40.0000 mg | DELAYED_RELEASE_CAPSULE | Freq: Every day | ORAL | 0 refills | Status: DC

## 2023-09-06 ENCOUNTER — Encounter: Payer: Self-pay | Admitting: Family

## 2023-09-07 ENCOUNTER — Other Ambulatory Visit: Payer: Self-pay

## 2023-09-07 ENCOUNTER — Encounter: Payer: Self-pay | Admitting: Family

## 2023-09-08 ENCOUNTER — Encounter: Payer: Self-pay | Admitting: Family

## 2023-09-08 MED ORDER — OMEPRAZOLE 40 MG PO CPDR: 40.0000 mg | DELAYED_RELEASE_CAPSULE | Freq: Every day | ORAL | 0 refills | Status: DC

## 2023-09-10 ENCOUNTER — Encounter: Payer: Self-pay | Admitting: Family

## 2023-09-15 ENCOUNTER — Ambulatory Visit
Admission: RE | Admit: 2023-09-15 | Discharge: 2023-09-15 | Disposition: A | Discharge status: Home / Self Care | Payer: 59 | Source: Ambulatory Visit | Attending: Nurse Practitioner | Admitting: Nurse Practitioner

## 2023-09-15 DIAGNOSIS — Z1231 Encounter for screening mammogram for malignant neoplasm of breast: Secondary | ICD-10-CM | POA: Diagnosis present

## 2023-09-18 ENCOUNTER — Ambulatory Visit: Payer: 59 | Admitting: Family

## 2023-09-20 ENCOUNTER — Encounter: Payer: Self-pay | Admitting: Family

## 2023-09-20 NOTE — Progress Notes (Signed)
Acute Office Visit  Subjective:     Patient ID: Kaitlyn Massey, female    DOB: 28-Jan-1976, 47 y.o.   MRN: [id number]  Patient is in today for No chief complaint on file.   Patient is here today with severe low back pain.  She has been feeling well in general, but says that she has been having pains in her lower back.   She says that it comes and goes.  Asks if it is possible that it could be a kidney stone.      Review of Systems  Musculoskeletal:  Positive for back pain.  All other systems reviewed and are negative.       Objective:    BP 121/74   Pulse 90   Ht 5\' 4"  (1.626 m)   Wt 275 lb 3.2 oz (124.8 kg)   SpO2 98%   BMI 47.24 kg/m   Physical Exam Vitals and nursing note reviewed.  Constitutional:      Appearance: Normal appearance. She is normal weight.  HENT:     Head: Normocephalic.  Eyes:     Pupils: Pupils are equal, round, and reactive to light.  Cardiovascular:     Rate and Rhythm: Normal rate.  Pulmonary:     Effort: Pulmonary effort is normal.  Neurological:     General: No focal deficit present.     Mental Status: She is alert and oriented to person, place, and time. Mental status is at baseline.  Psychiatric:        Mood and Affect: Mood normal.        Behavior: Behavior normal.        Thought Content: Thought content normal.        Judgment: Judgment normal.     Results for orders placed or performed in visit on 09/04/23  POCT Urinalysis Dipstick (81002)  Result Value Ref Range   Color, UA     Clarity, UA     Glucose, UA Negative Negative   Bilirubin, UA Negative    Ketones, UA 5    Spec Grav, UA >=1.030 (A) 1.010 - 1.025   Blood, UA 10    pH, UA 6.0 5.0 - 8.0   Protein, UA Positive (A) Negative   Urobilinogen, UA 0.2 0.2 or 1.0 E.U./dL   Nitrite, UA Negative    Leukocytes, UA Negative Negative   Appearance     Odor      Recent Results (from the past 2160 hour(s))  POCT Urinalysis Dipstick (40981)     Status: Abnormal    Collection Time: 09/04/23  3:13 PM  Result Value Ref Range   Color, UA     Clarity, UA     Glucose, UA Negative Negative   Bilirubin, UA Negative    Ketones, UA 5    Spec Grav, UA >=1.030 (A) 1.010 - 1.025   Blood, UA 10    pH, UA 6.0 5.0 - 8.0   Protein, UA Positive (A) Negative   Urobilinogen, UA 0.2 0.2 or 1.0 E.U./dL   Nitrite, UA Negative    Leukocytes, UA Negative Negative   Appearance     Odor         Assessment & Plan:   Problem List Items Addressed This Visit       Active Problems   Obesity, Class III, BMI 40-49.9 (morbid obesity) (HCC)   Other Visit Diagnoses     Acute bilateral low back pain without sciatica    -  Primary   UA in office today shows elevated specific gravity and positive protein.  Sending for UA/UC.   Will call with results.   Relevant Orders   POCT Urinalysis Dipstick (81002) (Completed)        Return to be determined based on results..  Total time spent: 20 minutes  Miki Kins, FNP  09/04/2023   This document may have been prepared by [facility] Voice Recognition software and as such may include unintentional dictation errors.

## 2023-09-21 ENCOUNTER — Other Ambulatory Visit: Payer: Self-pay

## 2023-09-21 ENCOUNTER — Encounter: Payer: Self-pay | Admitting: Family

## 2023-09-22 ENCOUNTER — Encounter: Payer: Self-pay | Admitting: Family

## 2023-09-22 MED ORDER — VENLAFAXINE HCL ER 37.5 MG PO CP24: 37.5000 mg | ORAL_CAPSULE | Freq: Every day | ORAL | 3 refills | Status: DC

## 2023-09-23 ENCOUNTER — Ambulatory Visit: Payer: 59 | Admitting: Family

## 2023-09-29 ENCOUNTER — Encounter: Payer: Self-pay | Admitting: Family

## 2023-10-02 ENCOUNTER — Ambulatory Visit: Payer: 59 | Admitting: Family

## 2023-11-20 ENCOUNTER — Ambulatory Visit: Payer: No Typology Code available for payment source | Admitting: Internal Medicine

## 2023-12-16 ENCOUNTER — Encounter: Payer: Self-pay | Admitting: Family

## 2023-12-19 ENCOUNTER — Encounter: Payer: Self-pay | Admitting: Family

## 2024-01-22 ENCOUNTER — Encounter: Payer: Self-pay | Admitting: Family

## 2024-02-12 ENCOUNTER — Ambulatory Visit: Payer: 59 | Admitting: Family

## 2024-02-12 ENCOUNTER — Encounter: Payer: Self-pay | Admitting: Family

## 2024-02-12 VITALS — BP 110/80 | HR 69 | Ht 64.0 in | Wt 288.0 lb

## 2024-02-12 DIAGNOSIS — I10 Essential (primary) hypertension: Secondary | ICD-10-CM | POA: Diagnosis not present

## 2024-02-12 DIAGNOSIS — E782 Mixed hyperlipidemia: Secondary | ICD-10-CM

## 2024-02-12 DIAGNOSIS — E0590 Thyrotoxicosis, unspecified without thyrotoxic crisis or storm: Secondary | ICD-10-CM

## 2024-02-12 DIAGNOSIS — R7303 Prediabetes: Secondary | ICD-10-CM

## 2024-02-12 DIAGNOSIS — R82998 Other abnormal findings in urine: Secondary | ICD-10-CM

## 2024-02-12 DIAGNOSIS — E559 Vitamin D deficiency, unspecified: Secondary | ICD-10-CM

## 2024-02-12 DIAGNOSIS — E538 Deficiency of other specified B group vitamins: Secondary | ICD-10-CM

## 2024-02-12 DIAGNOSIS — N3941 Urge incontinence: Secondary | ICD-10-CM

## 2024-02-12 MED ORDER — SPIRONOLACTONE 50 MG PO TABS: 50.0000 mg | ORAL_TABLET | Freq: Every day | ORAL | 2 refills | Status: DC

## 2024-02-12 MED ORDER — OMEPRAZOLE 40 MG PO CPDR: 40.0000 mg | DELAYED_RELEASE_CAPSULE | Freq: Every day | ORAL | 3 refills | Status: AC

## 2024-02-12 NOTE — Progress Notes (Signed)
 Established Patient Office Visit  Subjective:  Patient ID: Kaitlyn Massey, female    DOB: 1976-04-23  Age: 48 y.o. MRN: [id number]  Chief Complaint  Patient presents with   Follow-up    6 month follow up    Patient is here today for her 6 months follow up.  She has been feeling fairly well since last appointment.   She does have additional concerns to discuss today.  Dysuria - having some urinary urgency and pain with urination, as well as some urge incontinence.  Labs are due today. She needs refills.   I have reviewed her active problem list, medication list, allergies, notes from last encounter, lab results for her appointment today.      No other concerns at this time.   Past Medical History:  Diagnosis Date   Arthritis    Depression    GERD (gastroesophageal reflux disease)     Past Surgical History:  Procedure Laterality Date   None      Social History   Socioeconomic History   Marital status: Single    Spouse name: Not on file   Number of children: Not on file   Years of education: Not on file   Highest education level: Not on file  Occupational History   Not on file  Tobacco Use   Smoking status: Never   Smokeless tobacco: Never  Substance and Sexual Activity   Alcohol use: No   Drug use: No   Sexual activity: Not Currently    Birth control/protection: Patch  Other Topics Concern   Not on file  Social History Narrative   Not on file   Social Drivers of Health   Financial Resource Strain: Not on file  Food Insecurity: Not on file  Transportation Needs: Not on file  Physical Activity: Not on file  Stress: Not on file  Social Connections: Not on file  Intimate Partner Violence: Not on file    Family History  Problem Relation Age of Onset   Breast cancer Maternal Aunt    Colon cancer Neg Hx    Bladder Cancer Neg Hx    Kidney cancer Neg Hx     Allergies  Allergen Reactions   No Known Allergies     Review of Systems   Genitourinary:  Positive for dysuria and urgency.  Musculoskeletal:  Positive for back pain.  All other systems reviewed and are negative.      Objective:   BP 110/80   Pulse 69   Ht 5\' 4"  (1.626 m)   Wt 288 lb (130.6 kg)   SpO2 98%   BMI 49.44 kg/m   Vitals:   02/12/24 1148  BP: 110/80  Pulse: 69  Height: 5\' 4"  (1.626 m)  Weight: 288 lb (130.6 kg)  SpO2: 98%  BMI (Calculated): 49.41    Physical Exam Vitals and nursing note reviewed.  Constitutional:      Appearance: Normal appearance. She is normal weight.  HENT:     Head: Normocephalic.  Eyes:     Extraocular Movements: Extraocular movements intact.     Conjunctiva/sclera: Conjunctivae normal.     Pupils: Pupils are equal, round, and reactive to light.  Cardiovascular:     Rate and Rhythm: Normal rate.  Pulmonary:     Effort: Pulmonary effort is normal.  Neurological:     General: No focal deficit present.     Mental Status: She is alert and oriented to person, place, and time. Mental status is at  baseline.  Psychiatric:        Mood and Affect: Mood normal.        Behavior: Behavior normal.        Thought Content: Thought content normal.      Results for orders placed or performed in visit on 02/12/24  Lipid panel  Result Value Ref Range   Cholesterol, Total 214 (H) 100 - 199 mg/dL   Triglycerides 161 0 - 149 mg/dL   HDL 67 >09 mg/dL   VLDL Cholesterol Cal 18 5 - 40 mg/dL   LDL Chol Calc (NIH) 604 (H) 0 - 99 mg/dL   Chol/HDL Ratio 3.2 0.0 - 4.4 ratio  VITAMIN D 25 Hydroxy (Vit-D Deficiency, Fractures)  Result Value Ref Range   Vit D, 25-Hydroxy 24.8 (L) 30.0 - 100.0 ng/mL  CMP14+EGFR  Result Value Ref Range   Glucose 79 70 - 99 mg/dL   BUN 7 6 - 24 mg/dL   Creatinine, Ser 5.40 0.57 - 1.00 mg/dL   eGFR 981 >19 JY/NWG/9.56   BUN/Creatinine Ratio 10 9 - 23   Sodium 139 134 - 144 mmol/L   Potassium 3.9 3.5 - 5.2 mmol/L   Chloride 102 96 - 106 mmol/L   CO2 22 20 - 29 mmol/L   Calcium 9.4 8.7 -  10.2 mg/dL   Total Protein 7.3 6.0 - 8.5 g/dL   Albumin 4.1 3.9 - 4.9 g/dL   Globulin, Total 3.2 1.5 - 4.5 g/dL   Bilirubin Total 0.7 0.0 - 1.2 mg/dL   Alkaline Phosphatase 80 44 - 121 IU/L   AST 20 0 - 40 IU/L   ALT 15 0 - 32 IU/L  TSH  Result Value Ref Range   TSH 1.950 0.450 - 4.500 uIU/mL  Hemoglobin A1c  Result Value Ref Range   Hgb A1c MFr Bld 5.1 4.8 - 5.6 %   Est. average glucose Bld gHb Est-mCnc 100 mg/dL  Vitamin O13  Result Value Ref Range   Vitamin B-12 400 232 - 1,245 pg/mL  CBC with Diff  Result Value Ref Range   WBC 7.0 3.4 - 10.8 x10E3/uL   RBC 4.26 3.77 - 5.28 x10E6/uL   Hemoglobin 12.6 11.1 - 15.9 g/dL   Hematocrit 08.6 57.8 - 46.6 %   MCV 90 79 - 97 fL   MCH 29.6 26.6 - 33.0 pg   MCHC 32.7 31.5 - 35.7 g/dL   RDW 46.9 62.9 - 52.8 %   Platelets 291 150 - 450 x10E3/uL   Neutrophils 39 Not Estab. %   Lymphs 48 Not Estab. %   Monocytes 11 Not Estab. %   Eos 2 Not Estab. %   Basos 0 Not Estab. %   Neutrophils Absolute 2.7 1.4 - 7.0 x10E3/uL   Lymphocytes Absolute 3.4 (H) 0.7 - 3.1 x10E3/uL   Monocytes Absolute 0.8 0.1 - 0.9 x10E3/uL   EOS (ABSOLUTE) 0.1 0.0 - 0.4 x10E3/uL   Basophils Absolute 0.0 0.0 - 0.2 x10E3/uL   Immature Granulocytes 0 Not Estab. %   Immature Grans (Abs) 0.0 0.0 - 0.1 x10E3/uL    Recent Results (from the past 2160 hours)  Lipid panel     Status: Abnormal   Collection Time: 02/12/24  1:36 PM  Result Value Ref Range   Cholesterol, Total 214 (H) 100 - 199 mg/dL   Triglycerides 413 0 - 149 mg/dL   HDL 67 >24 mg/dL   VLDL Cholesterol Cal 18 5 - 40 mg/dL   LDL Chol Calc (NIH) 401 (  H) 0 - 99 mg/dL   Chol/HDL Ratio 3.2 0.0 - 4.4 ratio    Comment:                                   T. Chol/HDL Ratio                                             Men  Women                               1/2 Avg.Risk  3.4    3.3                                   Avg.Risk  5.0    4.4                                2X Avg.Risk  9.6    7.1                                 3X Avg.Risk 23.4   11.0   VITAMIN D 25 Hydroxy (Vit-D Deficiency, Fractures)     Status: Abnormal   Collection Time: 02/12/24  1:36 PM  Result Value Ref Range   Vit D, 25-Hydroxy 24.8 (L) 30.0 - 100.0 ng/mL    Comment: Vitamin D deficiency has been defined by the Institute of Medicine and an Endocrine Society practice guideline as a level of serum 25-OH vitamin D less than 20 ng/mL (1,2). The Endocrine Society went on to further define vitamin D insufficiency as a level between 21 and 29 ng/mL (2). 1. IOM (Institute of Medicine). 2010. Dietary reference    intakes for calcium and D. [place]: The    [employer]. 2. Holick MF, Binkley Concow, Bischoff-Ferrari HA, et al.    Evaluation, treatment, and prevention of vitamin D    deficiency: an Endocrine Society clinical practice    guideline. JCEM. 2011 Jul; 96(7):1911-30.   CMP14+EGFR     Status: None   Collection Time: 02/12/24  1:36 PM  Result Value Ref Range   Glucose 79 70 - 99 mg/dL   BUN 7 6 - 24 mg/dL   Creatinine, Ser 1.61 0.57 - 1.00 mg/dL   eGFR 096 >04 VW/UJW/1.19   BUN/Creatinine Ratio 10 9 - 23   Sodium 139 134 - 144 mmol/L   Potassium 3.9 3.5 - 5.2 mmol/L   Chloride 102 96 - 106 mmol/L   CO2 22 20 - 29 mmol/L   Calcium 9.4 8.7 - 10.2 mg/dL   Total Protein 7.3 6.0 - 8.5 g/dL   Albumin 4.1 3.9 - 4.9 g/dL   Globulin, Total 3.2 1.5 - 4.5 g/dL   Bilirubin Total 0.7 0.0 - 1.2 mg/dL   Alkaline Phosphatase 80 44 - 121 IU/L   AST 20 0 - 40 IU/L   ALT 15 0 - 32 IU/L  TSH     Status: None   Collection Time: 02/12/24  1:36 PM  Result Value Ref Range   TSH 1.950 0.450 -  4.500 uIU/mL  Hemoglobin A1c     Status: None   Collection Time: 02/12/24  1:36 PM  Result Value Ref Range   Hgb A1c MFr Bld 5.1 4.8 - 5.6 %    Comment:          Prediabetes: 5.7 - 6.4          Diabetes: >6.4          Glycemic control for adults with diabetes: <7.0    Est. average glucose Bld gHb Est-mCnc 100 mg/dL  Vitamin  Z61     Status: None   Collection Time: 02/12/24  1:36 PM  Result Value Ref Range   Vitamin B-12 400 232 - 1,245 pg/mL  CBC with Diff     Status: Abnormal   Collection Time: 02/12/24  1:36 PM  Result Value Ref Range   WBC 7.0 3.4 - 10.8 x10E3/uL   RBC 4.26 3.77 - 5.28 x10E6/uL   Hemoglobin 12.6 11.1 - 15.9 g/dL   Hematocrit 09.6 04.5 - 46.6 %   MCV 90 79 - 97 fL   MCH 29.6 26.6 - 33.0 pg   MCHC 32.7 31.5 - 35.7 g/dL   RDW 40.9 81.1 - 91.4 %   Platelets 291 150 - 450 x10E3/uL   Neutrophils 39 Not Estab. %   Lymphs 48 Not Estab. %   Monocytes 11 Not Estab. %   Eos 2 Not Estab. %   Basos 0 Not Estab. %   Neutrophils Absolute 2.7 1.4 - 7.0 x10E3/uL   Lymphocytes Absolute 3.4 (H) 0.7 - 3.1 x10E3/uL   Monocytes Absolute 0.8 0.1 - 0.9 x10E3/uL   EOS (ABSOLUTE) 0.1 0.0 - 0.4 x10E3/uL   Basophils Absolute 0.0 0.0 - 0.2 x10E3/uL   Immature Granulocytes 0 Not Estab. %   Immature Grans (Abs) 0.0 0.0 - 0.1 x10E3/uL       Assessment & Plan:   Problem List Items Addressed This Visit       Other   Obesity, Class III, BMI 40-49.9 (morbid obesity) (HCC) - Primary   Continue current meds.  Will adjust as needed based on results.  The patient is asked to make an attempt to improve diet and exercise patterns to aid in medical management of this problem. Addressed importance of increasing and maintaining water intake.        Relevant Orders   CMP14+EGFR (Completed)   CBC with Diff (Completed)   Calcium oxalate crystals present in urine   UA in office today.  Will call patient with results.       Other Visit Diagnoses       Prediabetes       A1C Continues to be in prediabetic ranges. Will reassess at follow up after next lab check. Patient counseled on dietary choices and verbalized understanding.   Relevant Orders   CMP14+EGFR (Completed)   Hemoglobin A1c (Completed)   CBC with Diff (Completed)     Essential hypertension, benign       Blood pressure well controlled with  current medications.  Continue current therapy.  Will reassess at follow up.   Relevant Medications   spironolactone (ALDACTONE) 50 MG tablet   Other Relevant Orders   CMP14+EGFR (Completed)   CBC with Diff (Completed)     Hyperthyroidism       Relevant Orders   CMP14+EGFR (Completed)   TSH (Completed)   CBC with Diff (Completed)     Vitamin D deficiency, unspecified       Checking labs  today.  Will continue supplements as needed.   Relevant Orders   VITAMIN D 25 Hydroxy (Vit-D Deficiency, Fractures) (Completed)   CMP14+EGFR (Completed)   CBC with Diff (Completed)     B12 deficiency due to diet       Checking labs today.  Will continue supplements as needed.   Relevant Orders   CMP14+EGFR (Completed)   Vitamin B12 (Completed)   CBC with Diff (Completed)     Mixed hyperlipidemia       Blood pressure well controlled with current medications.  Continue current therapy.  Will reassess at follow up.   Relevant Medications   spironolactone (ALDACTONE) 50 MG tablet   Other Relevant Orders   Lipid panel (Completed)   CMP14+EGFR (Completed)   CBC with Diff (Completed)     Urge incontinence       Continue with the Azo Bladder Control.  UA in office today.  Suggested pelvic floor exercises.       Return in about 6 months (around 08/11/2024) for F/U.   Total time spent: 20 minutes  Trenda Frisk, FNP  02/12/2024    This document may have been prepared by [facility] Voice Recognition software and as such may include unintentional dictation errors.

## 2024-02-12 NOTE — Assessment & Plan Note (Signed)
Continue current meds.  Will adjust as needed based on results.  The patient is asked to make an attempt to improve diet and exercise patterns to aid in medical management of this problem. Addressed importance of increasing and maintaining water intake.

## 2024-02-12 NOTE — Assessment & Plan Note (Signed)
UA in office today.  Will call patient with results.

## 2024-02-13 ENCOUNTER — Encounter: Payer: Self-pay | Admitting: Family

## 2024-02-13 LAB — CMP14+EGFR
ALT: 15 [IU]/L (ref 0–32)
AST: 20 [IU]/L (ref 0–40)
Albumin: 4.1 g/dL (ref 3.9–4.9)
Alkaline Phosphatase: 80 [IU]/L (ref 44–121)
BUN/Creatinine Ratio: 10 (ref 9–23)
BUN: 7 mg/dL (ref 6–24)
Bilirubin Total: 0.7 mg/dL (ref 0.0–1.2)
CO2: 22 mmol/L (ref 20–29)
Calcium: 9.4 mg/dL (ref 8.7–10.2)
Chloride: 102 mmol/L (ref 96–106)
Creatinine, Ser: 0.69 mg/dL (ref 0.57–1.00)
Globulin, Total: 3.2 g/dL (ref 1.5–4.5)
Glucose: 79 mg/dL (ref 70–99)
Potassium: 3.9 mmol/L (ref 3.5–5.2)
Sodium: 139 mmol/L (ref 134–144)
Total Protein: 7.3 g/dL (ref 6.0–8.5)
eGFR: 106 mL/min/{1.73_m2} (ref >59)

## 2024-02-13 LAB — VITAMIN D 25 HYDROXY (VIT D DEFICIENCY, FRACTURES): Vit D, 25-Hydroxy: 24.8 ng/mL — ABNORMAL LOW (ref 30.0–100.0)

## 2024-02-13 LAB — CBC WITH DIFFERENTIAL/PLATELET
Basophils Absolute: 0 10*3/uL (ref 0.0–0.2)
Basos: 0 %
EOS (ABSOLUTE): 0.1 10*3/uL (ref 0.0–0.4)
Eos: 2 %
Hematocrit: 38.5 % (ref 34.0–46.6)
Hemoglobin: 12.6 g/dL (ref 11.1–15.9)
Immature Grans (Abs): 0 10*3/uL (ref 0.0–0.1)
Immature Granulocytes: 0 %
Lymphocytes Absolute: 3.4 10*3/uL — ABNORMAL HIGH (ref 0.7–3.1)
Lymphs: 48 %
MCH: 29.6 pg (ref 26.6–33.0)
MCHC: 32.7 g/dL (ref 31.5–35.7)
MCV: 90 fL (ref 79–97)
Monocytes Absolute: 0.8 10*3/uL (ref 0.1–0.9)
Monocytes: 11 %
Neutrophils Absolute: 2.7 10*3/uL (ref 1.4–7.0)
Neutrophils: 39 %
Platelets: 291 10*3/uL (ref 150–450)
RBC: 4.26 x10E6/uL (ref 3.77–5.28)
RDW: 12.6 % (ref 11.7–15.4)
WBC: 7 10*3/uL (ref 3.4–10.8)

## 2024-02-13 LAB — TSH: TSH: 1.95 u[IU]/mL (ref 0.450–4.500)

## 2024-02-13 LAB — LIPID PANEL
Chol/HDL Ratio: 3.2 {ratio} (ref 0.0–4.4)
Cholesterol, Total: 214 mg/dL — ABNORMAL HIGH (ref 100–199)
HDL: 67 mg/dL (ref >39)
LDL Chol Calc (NIH): 129 mg/dL — ABNORMAL HIGH (ref 0–99)
Triglycerides: 100 mg/dL (ref 0–149)
VLDL Cholesterol Cal: 18 mg/dL (ref 5–40)

## 2024-02-13 LAB — HEMOGLOBIN A1C
Est. average glucose Bld gHb Est-mCnc: 100 mg/dL
Hgb A1c MFr Bld: 5.1 % (ref 4.8–5.6)

## 2024-02-13 LAB — VITAMIN B12: Vitamin B-12: 400 pg/mL (ref 232–1245)

## 2024-02-15 ENCOUNTER — Encounter: Payer: Self-pay | Admitting: Family

## 2024-02-15 NOTE — Telephone Encounter (Signed)
 See other message

## 2024-02-19 ENCOUNTER — Encounter: Payer: Self-pay | Admitting: Family

## 2024-02-19 ENCOUNTER — Other Ambulatory Visit: Payer: Self-pay

## 2024-02-19 MED ORDER — VITAMIN D (ERGOCALCIFEROL) 1.25 MG (50000 UNIT) PO CAPS: 50000.0000 [IU] | ORAL_CAPSULE | ORAL | 3 refills | Status: DC

## 2024-03-04 ENCOUNTER — Ambulatory Visit: Payer: 59 | Admitting: Family

## 2024-03-14 ENCOUNTER — Other Ambulatory Visit: Payer: Self-pay

## 2024-03-15 ENCOUNTER — Other Ambulatory Visit: Payer: Self-pay

## 2024-03-15 MED ORDER — VENLAFAXINE HCL ER 37.5 MG PO CP24: 37.5000 mg | ORAL_CAPSULE | Freq: Every day | ORAL | 3 refills | Status: AC

## 2024-03-22 ENCOUNTER — Encounter: Payer: Self-pay | Admitting: Family

## 2024-04-04 ENCOUNTER — Other Ambulatory Visit: Payer: Self-pay

## 2024-04-04 MED ORDER — HYDROCHLOROTHIAZIDE 25 MG PO TABS: 25.0000 mg | ORAL_TABLET | Freq: Every day | ORAL | 0 refills | Status: DC

## 2024-04-12 ENCOUNTER — Encounter: Payer: Self-pay | Admitting: Family

## 2024-04-14 ENCOUNTER — Other Ambulatory Visit: Payer: Self-pay | Admitting: Family

## 2024-04-15 ENCOUNTER — Encounter: Payer: Self-pay | Admitting: Family

## 2024-04-18 NOTE — Telephone Encounter (Signed)
 See other message

## 2024-05-02 ENCOUNTER — Encounter: Payer: Self-pay | Admitting: Family

## 2024-05-03 ENCOUNTER — Other Ambulatory Visit: Payer: Self-pay

## 2024-05-03 ENCOUNTER — Encounter: Payer: Self-pay | Admitting: Family

## 2024-05-03 DIAGNOSIS — G8929 Other chronic pain: Secondary | ICD-10-CM

## 2024-05-06 ENCOUNTER — Ambulatory Visit: Admitting: Family

## 2024-05-10 ENCOUNTER — Encounter: Payer: Self-pay | Admitting: Family

## 2024-05-13 ENCOUNTER — Encounter: Payer: Self-pay | Admitting: Family

## 2024-07-26 ENCOUNTER — Encounter: Payer: Self-pay | Admitting: Family

## 2024-07-29 ENCOUNTER — Other Ambulatory Visit: Payer: Self-pay

## 2024-07-29 MED ORDER — SCOPOLAMINE 1 MG/3DAYS TD PT72: 1.0000 | MEDICATED_PATCH | TRANSDERMAL | 1 refills | Status: DC

## 2024-08-04 LAB — COLOGUARD: COLOGUARD: NEGATIVE

## 2024-08-11 ENCOUNTER — Other Ambulatory Visit: Payer: Self-pay | Admitting: Family

## 2024-08-11 DIAGNOSIS — Z1231 Encounter for screening mammogram for malignant neoplasm of breast: Secondary | ICD-10-CM

## 2024-08-15 ENCOUNTER — Encounter: Payer: Self-pay | Admitting: Family

## 2024-08-19 ENCOUNTER — Encounter: Admission: RE | Payer: Self-pay | Source: Home / Self Care

## 2024-08-19 ENCOUNTER — Ambulatory Visit: Admission: RE | Admit: 2024-08-19 | Source: Home / Self Care | Admitting: Gastroenterology

## 2024-08-19 SURGERY — EGD (ESOPHAGOGASTRODUODENOSCOPY)
Anesthesia: General

## 2024-09-09 ENCOUNTER — Encounter: Payer: Self-pay | Admitting: Family

## 2024-09-09 ENCOUNTER — Ambulatory Visit: Admitting: Family

## 2024-09-09 VITALS — BP 122/84 | HR 70 | Ht 64.0 in | Wt 293.4 lb

## 2024-09-09 DIAGNOSIS — R7303 Prediabetes: Secondary | ICD-10-CM

## 2024-09-09 DIAGNOSIS — N3941 Urge incontinence: Secondary | ICD-10-CM | POA: Diagnosis not present

## 2024-09-09 DIAGNOSIS — E559 Vitamin D deficiency, unspecified: Secondary | ICD-10-CM

## 2024-09-09 DIAGNOSIS — E782 Mixed hyperlipidemia: Secondary | ICD-10-CM

## 2024-09-09 DIAGNOSIS — I10 Essential (primary) hypertension: Secondary | ICD-10-CM

## 2024-09-09 DIAGNOSIS — E66813 Obesity, class 3: Secondary | ICD-10-CM

## 2024-09-09 DIAGNOSIS — M25562 Pain in left knee: Secondary | ICD-10-CM

## 2024-09-09 DIAGNOSIS — E0590 Thyrotoxicosis, unspecified without thyrotoxic crisis or storm: Secondary | ICD-10-CM

## 2024-09-09 DIAGNOSIS — G8929 Other chronic pain: Secondary | ICD-10-CM

## 2024-09-09 DIAGNOSIS — E538 Deficiency of other specified B group vitamins: Secondary | ICD-10-CM

## 2024-09-09 DIAGNOSIS — M25561 Pain in right knee: Secondary | ICD-10-CM

## 2024-09-09 MED ORDER — OXYBUTYNIN CHLORIDE ER 10 MG PO TB24: 10.0000 mg | ORAL_TABLET | Freq: Every day | ORAL | 2 refills | Status: AC

## 2024-09-09 NOTE — Assessment & Plan Note (Signed)
 Continue current meds.  Will adjust as needed based on results.  The patient is asked to make an attempt to improve diet and exercise patterns to aid in medical management of this problem. Addressed importance of increasing and maintaining water  intake.

## 2024-09-09 NOTE — Progress Notes (Signed)
 Established Patient Office Visit  Subjective:  Patient ID: Kaitlyn Massey, female    DOB: 07/16/1976  Age: 48 y.o. MRN: [id number]  Chief Complaint  Patient presents with   Follow-up    Ortho referral    Patient is here today for her 3 months follow up.  She has been feeling fairly well since last appointment.   She does have additional concerns to discuss today.  She has been having some urinary incontinence, asks if we can try anything for this.  She has tried the OTC Meds, but they have not helped.  She also says that she has been having pain in her knees, as well as swelling. Asks if we can set up a referral to ortho  Labs are due today, but pt has already eaten and will get drawn at another time.  She needs refills.   I have reviewed her active problem list, medication list, allergies, notes from last encounter, lab results for her appointment today.      No other concerns at this time.   Past Medical History:  Diagnosis Date   Arthritis    Depression    GERD (gastroesophageal reflux disease)     Past Surgical History:  Procedure Laterality Date   None      Social History   Socioeconomic History   Marital status: Single    Spouse name: Not on file   Number of children: Not on file   Years of education: Not on file   Highest education level: Not on file  Occupational History   Not on file  Tobacco Use   Smoking status: Never   Smokeless tobacco: Never  Substance and Sexual Activity   Alcohol use: No   Drug use: No   Sexual activity: Not Currently    Birth control/protection: Patch  Other Topics Concern   Not on file  Social History Narrative   Not on file   Social Drivers of Health   Financial Resource Strain: Low Risk  (07/22/2024)   Received from [facility]   Overall Financial Resource Strain (CARDIA)    Difficulty of Paying Living Expenses: Not very hard  Food Insecurity: No Food Insecurity (07/22/2024)   Received  from [facility]   Hunger Vital Sign    Within the past 12 months, you worried that your food would run out before you got the money to buy more.: Never true    Within the past 12 months, the food you bought just didn't last and you didn't have money to get more.: Never true  Transportation Needs: No Transportation Needs (07/22/2024)   Received from [facility] - Transportation    In the past 12 months, has lack of transportation kept you from medical appointments or from getting medications?: No    Lack of Transportation (Non-Medical): No  Physical Activity: Not on file  Stress: Not on file  Social Connections: Not on file  Intimate Partner Violence: Not on file    Family History  Problem Relation Age of Onset   Breast cancer Maternal Aunt    Colon cancer Neg Hx    Bladder Cancer Neg Hx    Kidney cancer Neg Hx     Allergies  Allergen Reactions   No Known Allergies     Review of Systems  All other systems reviewed and are negative.      Objective:   BP 122/84   Pulse 70  Ht 5' 4 (1.626 m)   Wt 293 lb 6.4 oz (133.1 kg)   SpO2 98%   BMI 50.36 kg/m   Vitals:   09/09/24 1130  BP: 122/84  Pulse: 70  Height: 5' 4 (1.626 m)  Weight: 293 lb 6.4 oz (133.1 kg)  SpO2: 98%  BMI (Calculated): 50.34    Physical Exam Vitals and nursing note reviewed.  Constitutional:      Appearance: Normal appearance. She is normal weight.  HENT:     Head: Normocephalic.  Eyes:     Extraocular Movements: Extraocular movements intact.     Conjunctiva/sclera: Conjunctivae normal.     Pupils: Pupils are equal, round, and reactive to light.  Cardiovascular:     Rate and Rhythm: Normal rate.  Pulmonary:     Effort: Pulmonary effort is normal.  Neurological:     General: No focal deficit present.     Mental Status: She is alert and oriented to person, place, and time. Mental status is at baseline.  Psychiatric:        Mood and Affect:  Mood normal.        Behavior: Behavior normal.        Thought Content: Thought content normal.      No results found for any visits on 09/09/24.  Recent Results (from the past 2160 hours)  COLOGUARD     Status: Normal   Collection Time: 07/28/24  5:00 PM  Result Value Ref Range   COLOGUARD Negative Negative    Comment:  The Cologuard (TM) test was performed on this specimen.  NEGATIVE TEST RESULT. A negative Cologuard result indicates a low likelihood that a colorectal cancer (CRC) or advanced adenoma (adenomatous polyps with more advanced pre-malignant features) is present. The chance that a person with a negative Cologuard test has a colorectal cancer is less than 1 in 1500 (negative predictive value >99.9%) or has an advanced adenoma is less than 5.3% (negative predictive value 94.7%). These data are based on a prospective cross-sectional study of 10,000 individuals at average risk for colorectal cancer who were screened with both Cologuard and colonoscopy. (Imperiale T. et al, N Engl J Med 2014;370(14):1286-1297) The normal value (reference range) for this assay is negative.  COLOGUARD RE-SCREENING RECOMMENDATION: Periodic colorectal cancer screening is an important part of preventive healthcare for asymptomatic individuals at average risk for colorectal cancer. Following a negative Cologuard result, the American Cancer Society and U.S. Multi-Society Task Force screening guidelines recommend a Cologuard re-screening interval of 3 years.  References: American Cancer Society Guideline for Colorectal Cancer Screening: https://www.cancer.org/cancer/colon-rectal-cancer/detection-diagnosis-staging/acs-recommendations.html.; Rex DK, Boland CR, Dominitz JK, Colorectal Cancer Screening: Recommendations for Physicians and Patients from the U.S. Multi-Society Task Force on Colorectal Cancer Screening , Am J Gastroenterology 2017; 112:1016-1030.  TEST DESCRIPTION: Composite algorithmic analysis of  stool DNA-biomarkers with hemoglobin immunoassay.   Quantitative values of individual biomarkers are not reportable and are not associated with individual biomarker result reference ranges. Cologuard is intended for colorectal cancer screening of adults of either sex, 45 years or older, who are at average-risk for colorectal cancer (CRC). Cologuard has been approved for use by the U.S. FDA. The performance of Cologuard was established in a cross sectional study of average-risk adults aged 37-84. Cologuard performance in patients ages 72 to 20 years was estimated by sub-group analysis of near-age groups. Colonoscopies performed for a positive result may find as the most clinically significant lesion: colorectal cancer [4.0%], advanced adenoma (including sessile serrated polyps greater than or equal to  1cm diameter) [20%] or non- advanced adenoma [31%]; or no colorectal neoplasia [45%]. These estimates are derived from a prospective  cross-sectional screening study of 10,000 individuals at average risk for colorectal cancer who were screened with both Cologuard and colonoscopy. (Imperiale T. et al, LOISE [place] J Med 2014;370(14):1286-1297.) Cologuard may produce a false negative or false positive result (no colorectal cancer or precancerous polyp present at colonoscopy follow up). A negative Cologuard test result does not guarantee the absence of CRC or advanced adenoma (pre-cancer). The current Cologuard screening interval is every 3 years. Science writer and [place]. Therapist, music). Cologuard performance data in a 10,000 patient pivotal study using colonoscopy as the reference method can be accessed at the following location: [email or web address]. Additional description of the Cologuard test process, warnings and precautions can be found at [email or web address].       Assessment & Plan Prediabetes A1C Continues to be in prediabetic ranges.  Will reassess at follow up after next lab check.   Patient counseled on dietary choices and verbalized understanding.   -CBC w/Diff -CMP w/eGFR -Hemoglobin A1C  Essential hypertension, benign Blood pressure well controlled with current medications.  Continue current therapy.  Will reassess at follow up.   - CBC w/Diff - CMP w/eGFR  Obesity, Class III, BMI 40-49.9 (morbid obesity) Continue current meds.  Will adjust as needed based on results.  The patient is asked to make an attempt to improve diet and exercise patterns to aid in medical management of this problem. Addressed importance of increasing and maintaining water intake.   B12 deficiency due to diet Vitamin D  deficiency, unspecified Hyperthyroidism Checking labs today.  Will continue supplements as needed.   - Vitamin D  - Vitamin B12 - TSH  Mixed hyperlipidemia Checking labs today.  Continue current therapy for lipid control. Will modify as needed based on labwork results.   -CMP w/eGFR -Lipid Panel   Urge incontinence Trial of oxybutynin  sent for pt.  Will see how she is doing with this medication at follow up.   Chronic pain of both knees Setting patient up for referral to orthopedics.  Will defer to them for further treatment changes.  Reassess at follow up.     Return in about 2 weeks (around 09/23/2024).   Total time spent: 20 minutes  ALAN CHRISTELLA ARRANT, FNP  09/09/2024   This document may have been prepared by [facility] Voice Recognition software and as such may include unintentional dictation errors.

## 2024-09-17 ENCOUNTER — Encounter: Payer: Self-pay | Admitting: Family

## 2024-09-23 ENCOUNTER — Ambulatory Visit
Admission: RE | Admit: 2024-09-23 | Discharge: 2024-09-23 | Disposition: A | Discharge status: Home / Self Care | Source: Ambulatory Visit | Attending: Family | Admitting: Family

## 2024-09-23 DIAGNOSIS — Z1231 Encounter for screening mammogram for malignant neoplasm of breast: Secondary | ICD-10-CM | POA: Insufficient documentation

## 2024-09-24 LAB — CBC WITH DIFFERENTIAL/PLATELET
Basophils Absolute: 0 x10E3/uL (ref 0.0–0.2)
Basos: 0 %
EOS (ABSOLUTE): 0.2 x10E3/uL (ref 0.0–0.4)
Eos: 2 %
Hematocrit: 41.8 % (ref 34.0–46.6)
Hemoglobin: 13.2 g/dL (ref 11.1–15.9)
Immature Grans (Abs): 0 x10E3/uL (ref 0.0–0.1)
Immature Granulocytes: 0 %
Lymphocytes Absolute: 3.1 x10E3/uL (ref 0.7–3.1)
Lymphs: 46 %
MCH: 29.4 pg (ref 26.6–33.0)
MCHC: 31.6 g/dL (ref 31.5–35.7)
MCV: 93 fL (ref 79–97)
Monocytes Absolute: 0.7 x10E3/uL (ref 0.1–0.9)
Monocytes: 11 %
Neutrophils Absolute: 2.8 x10E3/uL (ref 1.4–7.0)
Neutrophils: 41 %
Platelets: 309 x10E3/uL (ref 150–450)
RBC: 4.49 x10E6/uL (ref 3.77–5.28)
RDW: 12.6 % (ref 11.7–15.4)
WBC: 6.8 x10E3/uL (ref 3.4–10.8)

## 2024-09-24 LAB — CMP14+EGFR
ALT: 16 IU/L (ref 0–32)
AST: 22 IU/L (ref 0–40)
Albumin: 4 g/dL (ref 3.9–4.9)
Alkaline Phosphatase: 69 IU/L (ref 41–116)
BUN/Creatinine Ratio: 9 (ref 9–23)
BUN: 6 mg/dL (ref 6–24)
Bilirubin Total: 0.6 mg/dL (ref 0.0–1.2)
CO2: 21 mmol/L (ref 20–29)
Calcium: 9.1 mg/dL (ref 8.7–10.2)
Chloride: 105 mmol/L (ref 96–106)
Creatinine, Ser: 0.66 mg/dL (ref 0.57–1.00)
Globulin, Total: 3.2 g/dL (ref 1.5–4.5)
Glucose: 82 mg/dL (ref 70–99)
Potassium: 4.4 mmol/L (ref 3.5–5.2)
Sodium: 141 mmol/L (ref 134–144)
Total Protein: 7.2 g/dL (ref 6.0–8.5)
eGFR: 107 mL/min/1.73 (ref >59)

## 2024-09-24 LAB — TSH: TSH: 3.06 u[IU]/mL (ref 0.450–4.500)

## 2024-09-24 LAB — LIPID PANEL
Chol/HDL Ratio: 3.7 ratio (ref 0.0–4.4)
Cholesterol, Total: 217 mg/dL — ABNORMAL HIGH (ref 100–199)
HDL: 59 mg/dL (ref >39)
LDL Chol Calc (NIH): 144 mg/dL — ABNORMAL HIGH (ref 0–99)
Triglycerides: 77 mg/dL (ref 0–149)
VLDL Cholesterol Cal: 14 mg/dL (ref 5–40)

## 2024-09-24 LAB — VITAMIN B12: Vitamin B-12: 477 pg/mL (ref 232–1245)

## 2024-09-24 LAB — HEMOGLOBIN A1C
Est. average glucose Bld gHb Est-mCnc: 100 mg/dL
Hgb A1c MFr Bld: 5.1 % (ref 4.8–5.6)

## 2024-09-24 LAB — VITAMIN D 25 HYDROXY (VIT D DEFICIENCY, FRACTURES): Vit D, 25-Hydroxy: 29.8 ng/mL — ABNORMAL LOW (ref 30.0–100.0)

## 2024-10-07 ENCOUNTER — Ambulatory Visit: Admitting: Family

## 2024-10-07 ENCOUNTER — Ambulatory Visit: Payer: Self-pay | Admitting: Family

## 2024-10-07 ENCOUNTER — Encounter: Payer: Self-pay | Admitting: Family

## 2024-10-07 VITALS — BP 136/88 | HR 69 | Ht 64.0 in | Wt 296.6 lb

## 2024-10-07 DIAGNOSIS — E0590 Thyrotoxicosis, unspecified without thyrotoxic crisis or storm: Secondary | ICD-10-CM | POA: Diagnosis not present

## 2024-10-07 DIAGNOSIS — E66813 Obesity, class 3: Secondary | ICD-10-CM | POA: Diagnosis not present

## 2024-10-07 DIAGNOSIS — E782 Mixed hyperlipidemia: Secondary | ICD-10-CM | POA: Diagnosis not present

## 2024-10-07 DIAGNOSIS — E538 Deficiency of other specified B group vitamins: Secondary | ICD-10-CM

## 2024-10-07 DIAGNOSIS — N3941 Urge incontinence: Secondary | ICD-10-CM

## 2024-10-07 DIAGNOSIS — E559 Vitamin D deficiency, unspecified: Secondary | ICD-10-CM

## 2024-10-07 DIAGNOSIS — I10 Essential (primary) hypertension: Secondary | ICD-10-CM

## 2024-10-07 MED ORDER — VITAMIN D (ERGOCALCIFEROL) 1.25 MG (50000 UNIT) PO CAPS: 50000.0000 [IU] | ORAL_CAPSULE | ORAL | 3 refills | Status: AC

## 2024-10-07 NOTE — Progress Notes (Signed)
 Established Patient Office Visit  Subjective:  Patient ID: Kaitlyn Massey, female    DOB: [birth year]  Age: 48 y.o. MRN: [id number]  Chief Complaint  Patient presents with   Follow-up    Patient is here today for her  follow up.  She has been feeling fairly well since last appointment.   She does have additional concerns to discuss today.  She is concerned about her issues with her weight, specifically she says she is worried about the methimazole and if that could be the reason that she has so much difficulty losing. She did not have this trouble prior to starting the medication to the best of her recollection.  The medication we sent previously for the urinary issues has been helping.   Labs are not due today, as they were done previously.   She needs refills.   I have reviewed her active problem list, medication list, allergies, notes from last encounter, lab results for her appointment today.      No other concerns at this time.   Past Medical History:  Diagnosis Date   Arthritis    Depression    GERD (gastroesophageal reflux disease)     Past Surgical History:  Procedure Laterality Date   None      Social History   Socioeconomic History   Marital status: Single    Spouse name: Not on file   Number of children: Not on file   Years of education: Not on file   Highest education level: Not on file  Occupational History   Not on file  Tobacco Use   Smoking status: Never   Smokeless tobacco: Never  Substance and Sexual Activity   Alcohol use: No   Drug use: No   Sexual activity: Not Currently    Birth control/protection: Patch  Other Topics Concern   Not on file  Social History Narrative   Not on file   Social Drivers of Health   Financial Resource Strain: Low Risk  (07/22/2024)   Received from [facility]   Overall Financial Resource Strain (CARDIA)    Difficulty of Paying Living Expenses: Not very hard  Food Insecurity: No  Food Insecurity (07/22/2024)   Received from [facility]   Hunger Vital Sign    Within the past 12 months, you worried that your food would run out before you got the money to buy more.: Never true    Within the past 12 months, the food you bought just didn't last and you didn't have money to get more.: Never true  Transportation Needs: No Transportation Needs (07/22/2024)   Received from [facility] - Transportation    In the past 12 months, has lack of transportation kept you from medical appointments or from getting medications?: No    Lack of Transportation (Non-Medical): No  Physical Activity: Not on file  Stress: Not on file  Social Connections: Not on file  Intimate Partner Violence: Not on file    Family History  Problem Relation Age of Onset   Breast cancer Maternal Aunt    Colon cancer Neg Hx    Bladder Cancer Neg Hx    Kidney cancer Neg Hx     Allergies  Allergen Reactions   No Known Allergies     Review of Systems  Constitutional:  Positive for diaphoresis and malaise/fatigue.  Genitourinary:  Positive for urgency.  All other systems reviewed and are negative.  Objective:   BP 136/88   Pulse 69   Ht 5' 4 (1.626 m)   Wt 296 lb 9.6 oz (134.5 kg)   SpO2 97%   BMI 50.91 kg/m   Vitals:   10/07/24 1121  BP: 136/88  Pulse: 69  Height: 5' 4 (1.626 m)  Weight: 296 lb 9.6 oz (134.5 kg)  SpO2: 97%  BMI (Calculated): 50.89    Physical Exam Vitals and nursing note reviewed.  Constitutional:      Appearance: Normal appearance. She is normal weight.  HENT:     Head: Normocephalic.  Eyes:     Extraocular Movements: Extraocular movements intact.     Conjunctiva/sclera: Conjunctivae normal.     Pupils: Pupils are equal, round, and reactive to light.  Cardiovascular:     Rate and Rhythm: Normal rate and regular rhythm.     Pulses: Normal pulses.  Pulmonary:     Effort: Pulmonary effort is normal.   Genitourinary:    General: Normal vulva.  Musculoskeletal:        General: Normal range of motion.     Cervical back: Normal range of motion.  Neurological:     General: No focal deficit present.     Mental Status: She is alert and oriented to person, place, and time. Mental status is at baseline.  Psychiatric:        Mood and Affect: Mood normal.        Behavior: Behavior normal.        Thought Content: Thought content normal.        Judgment: Judgment normal.      No results found for any visits on 10/07/24.  Recent Results (from the past 2160 hours)  COLOGUARD     Status: Normal   Collection Time: 07/28/24  5:00 PM  Result Value Ref Range   COLOGUARD Negative Negative    Comment:  The Cologuard (TM) test was performed on this specimen.  NEGATIVE TEST RESULT. A negative Cologuard result indicates a low likelihood that a colorectal cancer (CRC) or advanced adenoma (adenomatous polyps with more advanced pre-malignant features) is present. The chance that a person with a negative Cologuard test has a colorectal cancer is less than 1 in 1500 (negative predictive value >99.9%) or has an advanced adenoma is less than 5.3% (negative predictive value 94.7%). These data are based on a prospective cross-sectional study of 10,000 individuals at average risk for colorectal cancer who were screened with both Cologuard and colonoscopy. (Imperiale T. et al, N Engl J Med 2014;370(14):1286-1297) The normal value (reference range) for this assay is negative.  COLOGUARD RE-SCREENING RECOMMENDATION: Periodic colorectal cancer screening is an important part of preventive healthcare for asymptomatic individuals at average risk for colorectal cancer. Following a negative Cologuard result, the American Cancer Society and U.S. Multi-Society Task Force screening guidelines recommend a Cologuard re-screening interval of 3 years.  References: American Cancer Society Guideline for Colorectal Cancer Screening:  https://www.cancer.org/cancer/colon-rectal-cancer/detection-diagnosis-staging/acs-recommendations.html.; Rex DK, Boland CR, Dominitz JK, Colorectal Cancer Screening: Recommendations for Physicians and Patients from the U.S. Multi-Society Task Force on Colorectal Cancer Screening , Am J Gastroenterology 2017; 112:1016-1030.  TEST DESCRIPTION: Composite algorithmic analysis of stool DNA-biomarkers with hemoglobin immunoassay.   Quantitative values of individual biomarkers are not reportable and are not associated with individual biomarker result reference ranges. Cologuard is intended for colorectal cancer screening of adults of either sex, 45 years or older, who are at average-risk for colorectal cancer (CRC). Cologuard has been approved for use by the  U.S. FDA. The performance of Cologuard was established in a cross sectional study of average-risk adults aged 89-84. Cologuard performance in patients ages 55 to 50 years was estimated by sub-group analysis of near-age groups. Colonoscopies performed for a positive result may find as the most clinically significant lesion: colorectal cancer [4.0%], advanced adenoma (including sessile serrated polyps greater than or equal to 1cm diameter) [20%] or non- advanced adenoma [31%]; or no colorectal neoplasia [45%]. These estimates are derived from a prospective  cross-sectional screening study of 10,000 individuals at average risk for colorectal cancer who were screened with both Cologuard and colonoscopy. (Imperiale T. et al, LOISE [place] J Med 2014;370(14):1286-1297.) Cologuard may produce a false negative or false positive result (no colorectal cancer or precancerous polyp present at colonoscopy follow up). A negative Cologuard test result does not guarantee the absence of CRC or advanced adenoma (pre-cancer). The current Cologuard screening interval is every 3 years. Science Writer and [employer].[place]. Therapist, Music). Cologuard performance data in a 10,000 patient  pivotal study using colonoscopy as the reference method can be accessed at the following location: [email or web address]. Additional description of the Cologuard test process, warnings and precautions can be found at [email or web address].  CMP14+EGFR     Status: None   Collection Time: 09/23/24 11:23 AM  Result Value Ref Range   Glucose 82 70 - 99 mg/dL   BUN 6 6 - 24 mg/dL   Creatinine, Ser 9.33 0.57 - 1.00 mg/dL   eGFR 892 >40 fO/fpw/8.26   BUN/Creatinine Ratio 9 9 - 23   Sodium 141 134 - 144 mmol/L   Potassium 4.4 3.5 - 5.2 mmol/L   Chloride 105 96 - 106 mmol/L   CO2 21 20 - 29 mmol/L   Calcium 9.1 8.7 - 10.2 mg/dL   Total Protein 7.2 6.0 - 8.5 g/dL   Albumin 4.0 3.9 - 4.9 g/dL   Globulin, Total 3.2 1.5 - 4.5 g/dL   Bilirubin Total 0.6 0.0 - 1.2 mg/dL   Alkaline Phosphatase 69 41 - 116 IU/L    Comment:               **Please note reference interval change**   AST 22 0 - 40 IU/L   ALT 16 0 - 32 IU/L  Lipid panel     Status: Abnormal   Collection Time: 09/23/24 11:23 AM  Result Value Ref Range   Cholesterol, Total 217 (H) 100 - 199 mg/dL   Triglycerides 77 0 - 149 mg/dL   HDL 59 >60 mg/dL   VLDL Cholesterol Cal 14 5 - 40 mg/dL   LDL Chol Calc (NIH) 855 (H) 0 - 99 mg/dL   Chol/HDL Ratio 3.7 0.0 - 4.4 ratio    Comment:                                   T. Chol/HDL Ratio                                             Men  Women                               1/2 Avg.Risk  3.4    3.3  Avg.Risk  5.0    4.4                                2X Avg.Risk  9.6    7.1                                3X Avg.Risk 23.4   11.0   VITAMIN D  25 Hydroxy (Vit-D Deficiency, Fractures)     Status: Abnormal   Collection Time: 09/23/24 11:23 AM  Result Value Ref Range   Vit D, 25-Hydroxy 29.8 (L) 30.0 - 100.0 ng/mL    Comment: Vitamin D  deficiency has been defined by the Institute of Medicine and an Endocrine Society practice guideline as a level of serum 25-OH vitamin  D less than 20 ng/mL (1,2). The Endocrine Society went on to further define vitamin D  insufficiency as a level between 21 and 29 ng/mL (2). 1. IOM (Institute of Medicine). 2010. Dietary reference    intakes for calcium and D. [place]: The    [employer]. 2. Holick MF, Binkley Mercer, Bischoff-Ferrari HA, et al.    Evaluation, treatment, and prevention of vitamin D     deficiency: an Endocrine Society clinical practice    guideline. JCEM. 2011 Jul; 96(7):1911-30.   Vitamin B12     Status: None   Collection Time: 09/23/24 11:23 AM  Result Value Ref Range   Vitamin B-12 477 232 - 1,245 pg/mL  CBC with Diff     Status: None   Collection Time: 09/23/24 11:23 AM  Result Value Ref Range   WBC 6.8 3.4 - 10.8 x10E3/uL   RBC 4.49 3.77 - 5.28 x10E6/uL   Hemoglobin 13.2 11.1 - 15.9 g/dL   Hematocrit 58.1 65.9 - 46.6 %   MCV 93 79 - 97 fL   MCH 29.4 26.6 - 33.0 pg   MCHC 31.6 31.5 - 35.7 g/dL   RDW 87.3 88.2 - 84.5 %   Platelets 309 150 - 450 x10E3/uL   Neutrophils 41 Not Estab. %   Lymphs 46 Not Estab. %   Monocytes 11 Not Estab. %   Eos 2 Not Estab. %   Basos 0 Not Estab. %   Neutrophils Absolute 2.8 1.4 - 7.0 x10E3/uL   Lymphocytes Absolute 3.1 0.7 - 3.1 x10E3/uL   Monocytes Absolute 0.7 0.1 - 0.9 x10E3/uL   EOS (ABSOLUTE) 0.2 0.0 - 0.4 x10E3/uL   Basophils Absolute 0.0 0.0 - 0.2 x10E3/uL   Immature Granulocytes 0 Not Estab. %   Immature Grans (Abs) 0.0 0.0 - 0.1 x10E3/uL  Hemoglobin A1c     Status: None   Collection Time: 09/23/24 11:23 AM  Result Value Ref Range   Hgb A1c MFr Bld 5.1 4.8 - 5.6 %    Comment:          Prediabetes: 5.7 - 6.4          Diabetes: >6.4          Glycemic control for adults with diabetes: <7.0    Est. average glucose Bld gHb Est-mCnc 100 mg/dL  TSH     Status: None   Collection Time: 09/23/24 11:23 AM  Result Value Ref Range   TSH 3.060 0.450 - 4.500 uIU/mL       Assessment & Plan Obesity, Class III, BMI 40-49.9 (morbid obesity)  (HCC) Continue current meds.  Will adjust as  needed based on results.  The patient is asked to make an attempt to improve diet and exercise patterns to aid in medical management of this problem. Addressed importance of increasing and maintaining water intake.   Hyperthyroidism Patient to hold her thyroid  medication and we will see if levels are back to normals or not with blood draw in a few weeks.   Urge incontinence Meds we sent previously have not been helpful.  We will get her some labs   Mixed hyperlipidemia Continue current therapy for lipid control. Will modify as needed based on labwork results.   Vitamin D  deficiency, unspecified B12 deficiency due to diet Checking labs today.  Will continue supplements as needed.   - Vitamin D  - Vitamin B12 - TSH  Essential hypertension, benign Blood pressure well controlled with current medications.  Continue current therapy.  Will reassess at follow up.   - CBC w/Diff - CMP w/eGFR     Return in about 2 weeks (around 10/21/2024).   Total time spent: 20 minutes  ALAN CHRISTELLA ARRANT, FNP  10/07/2024   This document may have been prepared by [facility] Voice Recognition software and as such may include unintentional dictation errors.

## 2024-10-08 ENCOUNTER — Encounter: Payer: Self-pay | Admitting: Family

## 2024-10-11 ENCOUNTER — Encounter: Payer: Self-pay | Admitting: Family

## 2024-10-17 ENCOUNTER — Encounter: Payer: Self-pay | Admitting: Family

## 2024-10-20 ENCOUNTER — Encounter: Payer: Self-pay | Admitting: Family

## 2024-10-24 ENCOUNTER — Encounter: Payer: Self-pay | Admitting: Family

## 2024-10-27 ENCOUNTER — Encounter: Payer: Self-pay | Admitting: Family

## 2024-10-29 ENCOUNTER — Encounter: Payer: Self-pay | Admitting: Family

## 2024-10-29 NOTE — Assessment & Plan Note (Signed)
 Continue current meds.  Will adjust as needed based on results.  The patient is asked to make an attempt to improve diet and exercise patterns to aid in medical management of this problem. Addressed importance of increasing and maintaining water  intake.

## 2024-12-09 ENCOUNTER — Ambulatory Visit: Admitting: Family

## 2024-12-09 ENCOUNTER — Encounter: Payer: Self-pay | Admitting: Family

## 2024-12-09 VITALS — BP 146/90 | HR 68 | Ht 64.0 in | Wt 292.4 lb

## 2024-12-09 DIAGNOSIS — R635 Abnormal weight gain: Secondary | ICD-10-CM | POA: Diagnosis not present

## 2024-12-09 DIAGNOSIS — E0500 Thyrotoxicosis with diffuse goiter without thyrotoxic crisis or storm: Secondary | ICD-10-CM | POA: Diagnosis not present

## 2024-12-10 ENCOUNTER — Encounter: Payer: Self-pay | Admitting: Family

## 2024-12-10 LAB — FSH+PROG+E2+SHBG
Estradiol: 164 pg/mL
FSH: 12.1 m[IU]/mL
Progesterone: 0.4 ng/mL
Sex Hormone Binding: 45.6 nmol/L (ref 17.3–125.0)

## 2024-12-10 LAB — TESTOSTERONE,FREE AND TOTAL
Testosterone, Free: 0.3 pg/mL (ref 0.0–4.2)
Testosterone: 10 ng/dL (ref 4–50)

## 2024-12-10 LAB — TSH+T4F+T3FREE
Free T4: 0.99 ng/dL (ref 0.82–1.77)
T3, Free: 2.7 pg/mL (ref 2.0–4.4)
TSH: 2.48 u[IU]/mL (ref 0.450–4.500)

## 2024-12-10 LAB — LUTEINIZING HORMONE: LH: 31.2 m[IU]/mL

## 2024-12-10 NOTE — Progress Notes (Signed)
 Established Patient Office Visit  Subjective:  Patient ID: Kaitlyn Massey, female    DOB: Nov 21, 1976  Age: 48 y.o. MRN: [id number]  Chief Complaint  Patient presents with   Follow-up    Discuss thyroid     Patient is here today for follow up appointment.  She says that she would like to figure out what is going on with her thyroid .  She has had multiple different results in her blood work in the last few years, indicating both hyper and hypo thyroidism, and it isn't clear what medications she was taking at what times.   She's currently on a half dose of her methimazole, as we had discussed tapering off of it.  She is concerned that the methimazole might be causing her to gain weight, as she says she never had trouble with this prior to starting the medication.    No other concerns at this time.   Past Medical History:  Diagnosis Date   Arthritis    Depression    GERD (gastroesophageal reflux disease)     Past Surgical History:  Procedure Laterality Date   None      Social History   Socioeconomic History   Marital status: Single    Spouse name: Not on file   Number of children: Not on file   Years of education: Not on file   Highest education level: Not on file  Occupational History   Not on file  Tobacco Use   Smoking status: Never   Smokeless tobacco: Never  Substance and Sexual Activity   Alcohol use: No   Drug use: No   Sexual activity: Not Currently    Birth control/protection: Patch  Other Topics Concern   Not on file  Social History Narrative   Not on file   Social Drivers of Health   Tobacco Use: Low Risk (12/09/2024)   Patient History    Smoking Tobacco Use: Never    Smokeless Tobacco Use: Never    Passive Exposure: Not on file  Financial Resource Strain: Low Risk  (10/26/2024)   Received from [facility]   Overall Financial Resource Strain (CARDIA)    Difficulty of Paying Living Expenses: Not very hard  Food  Insecurity: No Food Insecurity (10/26/2024)   Received from [facility]   Epic    Within the past 12 months, you worried that your food would run out before you got the money to buy more.: Never true    Within the past 12 months, the food you bought just didn't last and you didn't have money to get more.: Never true  Transportation Needs: No Transportation Needs (10/26/2024)   Received from [facility] - Transportation    In the past 12 months, has lack of transportation kept you from medical appointments or from getting medications?: No    Lack of Transportation (Non-Medical): No  Physical Activity: Not on file  Stress: Not on file  Social Connections: Not on file  Intimate Partner Violence: Not on file  Depression (EYV7-0): Medium Risk (02/26/2024)   Depression (PHQ2-9)    PHQ-2 Score: 8  Alcohol Screen: Not on file  Housing: Low Risk  (10/26/2024)   Received from [facility]   Epic    In the last 12 months, was there a time when you were not able to pay the mortgage or rent on time?: No    In the past 12 months, how many  times have you moved where you were living?: 0    At any time in the past 12 months, were you homeless or living in a shelter (including now)?: No  Utilities: Not At Risk (10/26/2024)   Received from [facility]   Epic    In the past 12 months has the electric, gas, oil, or water company threatened to shut off services in your home?: No  Health Literacy: Not on file    Family History  Problem Relation Age of Onset   Breast cancer Maternal Aunt    Colon cancer Neg Hx    Bladder Cancer Neg Hx    Kidney cancer Neg Hx     Allergies[1]  Review of Systems  All other systems reviewed and are negative.      Objective:   BP (!) 146/90   Pulse 68   Ht 5' 4 (1.626 m)   Wt 292 lb 6.4 oz (132.6 kg)   SpO2 97%   BMI 50.19 kg/m   Vitals:   12/09/24 0918  BP: (!) 146/90   Pulse: 68  Height: 5' 4 (1.626 m)  Weight: 292 lb 6.4 oz (132.6 kg)  SpO2: 97%  BMI (Calculated): 50.17    Physical Exam Vitals and nursing note reviewed.  Constitutional:      General: She is awake.     Appearance: Normal appearance. She is well-developed and well-groomed. She is obese.  HENT:     Head: Normocephalic and atraumatic.  Eyes:     General: Lids are normal.     Extraocular Movements: Extraocular movements intact.     Conjunctiva/sclera: Conjunctivae normal.     Pupils: Pupils are equal, round, and reactive to light.  Cardiovascular:     Rate and Rhythm: Normal rate.  Pulmonary:     Effort: Pulmonary effort is normal.  Neurological:     General: No focal deficit present.     Mental Status: She is alert and oriented to person, place, and time. Mental status is at baseline.  Psychiatric:        Mood and Affect: Mood normal.        Behavior: Behavior normal. Behavior is cooperative.        Thought Content: Thought content normal.      No results found for any visits on 12/09/24.  Recent Results (from the past 2160 hours)  CMP14+EGFR     Status: None   Collection Time: 09/23/24 11:23 AM  Result Value Ref Range   Glucose 82 70 - 99 mg/dL   BUN 6 6 - 24 mg/dL   Creatinine, Ser 9.33 0.57 - 1.00 mg/dL   eGFR 892 >40 fO/fpw/8.26   BUN/Creatinine Ratio 9 9 - 23   Sodium 141 134 - 144 mmol/L   Potassium 4.4 3.5 - 5.2 mmol/L   Chloride 105 96 - 106 mmol/L   CO2 21 20 - 29 mmol/L   Calcium 9.1 8.7 - 10.2 mg/dL   Total Protein 7.2 6.0 - 8.5 g/dL   Albumin 4.0 3.9 - 4.9 g/dL   Globulin, Total 3.2 1.5 - 4.5 g/dL   Bilirubin Total 0.6 0.0 - 1.2 mg/dL   Alkaline Phosphatase 69 41 - 116 IU/L    Comment:               **Please note reference interval change**   AST 22 0 - 40 IU/L   ALT 16 0 - 32 IU/L  Lipid panel     Status: Abnormal  Collection Time: 09/23/24 11:23 AM  Result Value Ref Range   Cholesterol, Total 217 (H) 100 - 199 mg/dL   Triglycerides 77 0 -  149 mg/dL   HDL 59 >60 mg/dL   VLDL Cholesterol Cal 14 5 - 40 mg/dL   LDL Chol Calc (NIH) 855 (H) 0 - 99 mg/dL   Chol/HDL Ratio 3.7 0.0 - 4.4 ratio    Comment:                                   T. Chol/HDL Ratio                                             Men  Women                               1/2 Avg.Risk  3.4    3.3                                   Avg.Risk  5.0    4.4                                2X Avg.Risk  9.6    7.1                                3X Avg.Risk 23.4   11.0   VITAMIN D  25 Hydroxy (Vit-D Deficiency, Fractures)     Status: Abnormal   Collection Time: 09/23/24 11:23 AM  Result Value Ref Range   Vit D, 25-Hydroxy 29.8 (L) 30.0 - 100.0 ng/mL    Comment: Vitamin D  deficiency has been defined by the Institute of Medicine and an Endocrine Society practice guideline as a level of serum 25-OH vitamin D  less than 20 ng/mL (1,2). The Endocrine Society went on to further define vitamin D  insufficiency as a level between 21 and 29 ng/mL (2). 1. IOM (Institute of Medicine). 2010. Dietary reference    intakes for calcium and D. [place]: The    [employer]. 2. Holick MF, Binkley [place], Bischoff-Ferrari HA, et al.    Evaluation, treatment, and prevention of vitamin D     deficiency: an Endocrine Society clinical practice    guideline. JCEM. 2011 Jul; 96(7):1911-30.   Vitamin B12     Status: None   Collection Time: 09/23/24 11:23 AM  Result Value Ref Range   Vitamin B-12 477 232 - 1,245 pg/mL  CBC with Diff     Status: None   Collection Time: 09/23/24 11:23 AM  Result Value Ref Range   WBC 6.8 3.4 - 10.8 x10E3/uL   RBC 4.49 3.77 - 5.28 x10E6/uL   Hemoglobin 13.2 11.1 - 15.9 g/dL   Hematocrit 58.1 65.9 - 46.6 %   MCV 93 79 - 97 fL   MCH 29.4 26.6 - 33.0 pg   MCHC 31.6 31.5 - 35.7 g/dL   RDW 87.3 88.2 - 84.5 %   Platelets 309 150 - 450 x10E3/uL   Neutrophils 41 Not Estab. %   Lymphs 46 Not Estab. %  Monocytes 11 Not Estab. %   Eos 2 Not Estab. %    Basos 0 Not Estab. %   Neutrophils Absolute 2.8 1.4 - 7.0 x10E3/uL   Lymphocytes Absolute 3.1 0.7 - 3.1 x10E3/uL   Monocytes Absolute 0.7 0.1 - 0.9 x10E3/uL   EOS (ABSOLUTE) 0.2 0.0 - 0.4 x10E3/uL   Basophils Absolute 0.0 0.0 - 0.2 x10E3/uL   Immature Granulocytes 0 Not Estab. %   Immature Grans (Abs) 0.0 0.0 - 0.1 x10E3/uL  Hemoglobin A1c     Status: None   Collection Time: 09/23/24 11:23 AM  Result Value Ref Range   Hgb A1c MFr Bld 5.1 4.8 - 5.6 %    Comment:          Prediabetes: 5.7 - 6.4          Diabetes: >6.4          Glycemic control for adults with diabetes: <7.0    Est. average glucose Bld gHb Est-mCnc 100 mg/dL  TSH     Status: None   Collection Time: 09/23/24 11:23 AM  Result Value Ref Range   TSH 3.060 0.450 - 4.500 uIU/mL       Assessment & Plan Abnormal weight gain Checking her TSH, T3 and T4, but also checking her hormone levels today.  Will call with results when available, so we can see if there is a possibility that she could be perimenopausal as well.   Graves' disease Checking TSH, T3 and T4 today.  Will call with results when available.      Return as previously scheduled.   Total time spent: 20 minutes  ALAN CHRISTELLA ARRANT, FNP  12/09/2024   This document may have been prepared by [facility] Voice Recognition software and as such may include unintentional dictation errors.      [1]  Allergies Allergen Reactions   No Known Allergies

## 2024-12-15 ENCOUNTER — Encounter: Payer: Self-pay | Admitting: Family

## 2024-12-20 ENCOUNTER — Encounter: Payer: Self-pay | Admitting: Family

## 2024-12-21 NOTE — Telephone Encounter (Signed)
 Per Mylinda Asa already addressed

## 2025-01-07 ENCOUNTER — Encounter: Payer: Self-pay | Admitting: Family

## 2025-01-09 ENCOUNTER — Other Ambulatory Visit: Payer: Self-pay

## 2025-01-09 MED ORDER — METHIMAZOLE 5 MG PO TABS: 5.0000 mg | ORAL_TABLET | Freq: Every day | ORAL | 0 refills | Status: AC

## 2025-02-02 ENCOUNTER — Ambulatory Visit: Payer: Self-pay

## 2025-02-10 ENCOUNTER — Ambulatory Visit: Payer: Self-pay | Admitting: Family
